# Patient Record
Sex: Female | Born: 1950 | Race: White | Hispanic: No | Marital: Married | Smoking: Never smoker
Health system: Southern US, Community
[De-identification: ages and names within clinical notes are randomized; demographics above are authoritative.]

## PROBLEM LIST (undated history)

## (undated) DIAGNOSIS — I639 Cerebral infarction, unspecified: Secondary | ICD-10-CM

## (undated) DIAGNOSIS — F319 Bipolar disorder, unspecified: Secondary | ICD-10-CM

## (undated) DIAGNOSIS — E039 Hypothyroidism, unspecified: Secondary | ICD-10-CM

## (undated) DIAGNOSIS — E7800 Pure hypercholesterolemia, unspecified: Secondary | ICD-10-CM

## (undated) DIAGNOSIS — E079 Disorder of thyroid, unspecified: Secondary | ICD-10-CM

## (undated) DIAGNOSIS — R29898 Other symptoms and signs involving the musculoskeletal system: Secondary | ICD-10-CM

## (undated) DIAGNOSIS — E785 Hyperlipidemia, unspecified: Secondary | ICD-10-CM

## (undated) DIAGNOSIS — J45909 Unspecified asthma, uncomplicated: Secondary | ICD-10-CM

## (undated) DIAGNOSIS — G459 Transient cerebral ischemic attack, unspecified: Secondary | ICD-10-CM

## (undated) DIAGNOSIS — M069 Rheumatoid arthritis, unspecified: Secondary | ICD-10-CM

## (undated) DIAGNOSIS — I6340 Cerebral infarction due to embolism of unspecified cerebral artery: Secondary | ICD-10-CM

## (undated) DIAGNOSIS — R05 Cough: Secondary | ICD-10-CM

## (undated) HISTORY — DX: Hypothyroidism, unspecified: E03.9

## (undated) HISTORY — PX: FOOT SURGERY: SHX648

## (undated) HISTORY — DX: Cerebral infarction, unspecified: I63.9

## (undated) HISTORY — DX: Cerebral infarction due to embolism of unspecified cerebral artery: I63.40

## (undated) HISTORY — DX: Rheumatoid arthritis, unspecified: M06.9

## (undated) HISTORY — DX: Cough: R05

## (undated) HISTORY — DX: Pure hypercholesterolemia, unspecified: E78.00

## (undated) HISTORY — DX: Unspecified asthma, uncomplicated: J45.909

## (undated) HISTORY — DX: Bipolar disorder, unspecified: F31.9

## (undated) HISTORY — DX: Transient cerebral ischemic attack, unspecified: G45.9

## (undated) HISTORY — DX: Hyperlipidemia, unspecified: E78.5

## (undated) HISTORY — DX: Disorder of thyroid, unspecified: E07.9

## (undated) HISTORY — DX: Other symptoms and signs involving the musculoskeletal system: R29.898

---

## 2000-11-13 ENCOUNTER — Encounter (INDEPENDENT_AMBULATORY_CARE_PROVIDER_SITE_OTHER): Payer: Self-pay | Admitting: Specialist

## 2000-11-13 ENCOUNTER — Ambulatory Visit (HOSPITAL_BASED_OUTPATIENT_CLINIC_OR_DEPARTMENT_OTHER): Admission: RE | Admit: 2000-11-13 | Discharge: 2000-11-13 | Payer: Self-pay | Admitting: General Surgery

## 2000-11-20 ENCOUNTER — Other Ambulatory Visit: Admission: RE | Admit: 2000-11-20 | Discharge: 2000-11-20 | Payer: Self-pay | Admitting: *Deleted

## 2000-11-29 ENCOUNTER — Other Ambulatory Visit: Admission: RE | Admit: 2000-11-29 | Discharge: 2000-11-29 | Payer: Self-pay | Admitting: *Deleted

## 2000-11-29 ENCOUNTER — Encounter (INDEPENDENT_AMBULATORY_CARE_PROVIDER_SITE_OTHER): Payer: Self-pay | Admitting: Specialist

## 2002-10-09 ENCOUNTER — Other Ambulatory Visit: Admission: RE | Admit: 2002-10-09 | Discharge: 2002-10-09 | Payer: Self-pay | Admitting: Obstetrics and Gynecology

## 2003-10-10 ENCOUNTER — Other Ambulatory Visit: Admission: RE | Admit: 2003-10-10 | Discharge: 2003-10-10 | Payer: Self-pay | Admitting: Obstetrics and Gynecology

## 2004-10-13 ENCOUNTER — Other Ambulatory Visit: Admission: RE | Admit: 2004-10-13 | Discharge: 2004-10-13 | Payer: Self-pay | Admitting: Obstetrics and Gynecology

## 2004-11-12 ENCOUNTER — Ambulatory Visit (HOSPITAL_COMMUNITY): Admission: RE | Admit: 2004-11-12 | Discharge: 2004-11-12 | Payer: Self-pay | Admitting: Gastroenterology

## 2004-11-12 ENCOUNTER — Encounter (INDEPENDENT_AMBULATORY_CARE_PROVIDER_SITE_OTHER): Payer: Self-pay | Admitting: *Deleted

## 2005-10-31 ENCOUNTER — Other Ambulatory Visit: Admission: RE | Admit: 2005-10-31 | Discharge: 2005-10-31 | Payer: Self-pay | Admitting: Obstetrics and Gynecology

## 2006-02-01 ENCOUNTER — Emergency Department (HOSPITAL_COMMUNITY): Admission: EM | Admit: 2006-02-01 | Discharge: 2006-02-01 | Payer: Self-pay | Admitting: Emergency Medicine

## 2006-09-25 ENCOUNTER — Encounter: Admission: RE | Admit: 2006-09-25 | Discharge: 2006-09-25 | Payer: Self-pay | Admitting: Family Medicine

## 2006-09-25 ENCOUNTER — Encounter (INDEPENDENT_AMBULATORY_CARE_PROVIDER_SITE_OTHER): Payer: Self-pay | Admitting: Specialist

## 2007-10-02 ENCOUNTER — Encounter: Admission: RE | Admit: 2007-10-02 | Discharge: 2007-10-02 | Payer: Self-pay | Admitting: Family Medicine

## 2007-12-04 ENCOUNTER — Emergency Department (HOSPITAL_COMMUNITY): Admission: EM | Admit: 2007-12-04 | Discharge: 2007-12-04 | Payer: Self-pay | Admitting: Emergency Medicine

## 2008-10-02 ENCOUNTER — Encounter: Admission: RE | Admit: 2008-10-02 | Discharge: 2008-10-02 | Payer: Self-pay | Admitting: Obstetrics and Gynecology

## 2009-08-23 ENCOUNTER — Emergency Department (HOSPITAL_COMMUNITY): Admission: EM | Admit: 2009-08-23 | Discharge: 2009-08-23 | Payer: Self-pay | Admitting: Emergency Medicine

## 2009-10-06 ENCOUNTER — Encounter: Admission: RE | Admit: 2009-10-06 | Discharge: 2009-10-06 | Payer: Self-pay | Admitting: Obstetrics and Gynecology

## 2010-08-11 LAB — POCT I-STAT, CHEM 8
BUN: 14 mg/dL (ref 6–23)
Calcium, Ion: 1.23 mmol/L (ref 1.12–1.32)
Creatinine, Ser: 0.7 mg/dL (ref 0.4–1.2)
Hemoglobin: 12.2 g/dL (ref 12.0–15.0)
TCO2: 25 mmol/L (ref 0–100)

## 2010-08-11 LAB — URINALYSIS, ROUTINE W REFLEX MICROSCOPIC
Hgb urine dipstick: NEGATIVE
Specific Gravity, Urine: 1.012 (ref 1.005–1.030)
pH: 7 (ref 5.0–8.0)

## 2010-08-11 LAB — POCT CARDIAC MARKERS
Myoglobin, poc: 68.1 ng/mL (ref 12–200)
Troponin i, poc: <0.05 ng/mL (ref 0.00–0.09)

## 2010-09-14 ENCOUNTER — Other Ambulatory Visit: Payer: Self-pay | Admitting: Obstetrics and Gynecology

## 2010-09-14 DIAGNOSIS — Z1231 Encounter for screening mammogram for malignant neoplasm of breast: Secondary | ICD-10-CM

## 2010-09-29 ENCOUNTER — Ambulatory Visit
Admission: RE | Admit: 2010-09-29 | Discharge: 2010-09-29 | Disposition: A | Discharge status: Home / Self Care | Payer: 59 | Source: Ambulatory Visit | Attending: Family Medicine | Admitting: Family Medicine

## 2010-09-29 ENCOUNTER — Other Ambulatory Visit: Payer: Self-pay | Admitting: Family Medicine

## 2010-09-29 DIAGNOSIS — R52 Pain, unspecified: Secondary | ICD-10-CM

## 2010-09-29 MED ORDER — IOHEXOL 300 MG/ML  SOLN
100.0000 mL | Freq: Once | INTRAMUSCULAR | Status: AC | PRN
  Administered 2010-09-29: 100 mL via INTRAVENOUS

## 2010-10-08 NOTE — Op Note (Signed)
NAME:  Shannon Collins, Shannon Collins              ACCOUNT NO.:  [id number]   MEDICAL RECORD NO.:  [id number]          PATIENT TYPE:  AMB   LOCATION:  ENDO                         FACILITY:  [facility]   PHYSICIAN:  Anselmo Rod, M.D.  DATE OF BIRTH:  17-Dec-1950   DATE OF PROCEDURE:  11/11/2004  DATE OF DISCHARGE:                                 OPERATIVE REPORT   PROCEDURE PERFORMED:  Esophagogastroduodenoscopy with gastric and small  bowel biopsies.   ENDOSCOPIST:  Charna Elizabeth, M.D.   INSTRUMENT USED:  Olympus video panendoscope.   INDICATIONS FOR PROCEDURE:  The patient is a 60 year old white female with a  history of rheumatoid arthritis and bipolar disorder undergoing  esophagogastroduodenoscopy for epigastric pain.  Rule out peptic ulcer  disease, esophagitis, gastritis, etc.  Small bowel biopsy is also planned as  the patient has had diarrhea and there is a family history of sprue  associated with her mother and her brother.   PREPROCEDURE PREPARATION:  Informed consent was procured from the patient.  The patient was fasted for eight hours prior to the procedure.   PREPROCEDURE PHYSICAL:  The patient had stable vital signs.  Neck supple,  chest clear to auscultation.  S1, S2 regular.  Abdomen soft with normal  bowel sounds.   DESCRIPTION OF PROCEDURE:  The patient was placed in the left lateral  decubitus position and sedated with 50 mg of Demerol and 5 mg of Versed in  slow incremental doses.  Once the patient was adequately sedated and  maintained on low-flow oxygen and continuous cardiac monitoring, the Olympus  video panendoscope was advanced through the mouth piece over the tongue into  the esophagus under direct vision.  The entire esophagus appeared normal  with no evidence of ring, stricture, masses, esophagitis or Barrett's  mucosa.  The scope was then advanced to the stomach.  A small hiatal hernia  was seen on high retroflexion.  There was evidence of moderate gastritis  throughout the gastric mucosa with a few erosions.  Biopsies were done to  rule out presence of Helicobacter pylori by pathology.  Proximal small bowel  appeared normal.  There was no outlet obstruction.  Proximal bowel biopsies  were done to rule out sprue.  The patient tolerated the procedure well  without complications.   IMPRESSION:  1.  Normal-appearing esophagus.  2.  Small hiatal hernia.  3.  Moderate gastritis with few antral erosions. Biopsy done for      Helicobacter pylori  for pathology.  4.  Normal proximal small bowel biopsies done to rule out sprue.   RECOMMENDATIONS:  1.  Await pathology results.  2.  Continue proton pump inhibitor.  3.  Avoid all nonsteroidals.  4.  Treat with antibiotics if Helicobacter pylori present.  5.  Proceed with a colonoscopy at this time.  Further recommendation will be      made thereafter.       JNM/MEDQ  D:  11/12/2004  T:  11/12/2004  Job:  [id number]   cc:   Sigmund Hazel, M.D.  [address]  [address]  [place], [place]  [address]  Fax: [phone]   Demetria Pore. Coral Spikes, M.D.  [address]  [address]  [place]  [place] [address]  Fax: [phone]

## 2010-10-08 NOTE — Op Note (Signed)
NAME:  Shannon Collins, Shannon Collins              ACCOUNT NO.:  [id number]   MEDICAL RECORD NO.:  [id number]          PATIENT TYPE:  AMB   LOCATION:  ENDO                         FACILITY:  [facility]   PHYSICIAN:  Anselmo Rod, M.D.  DATE OF BIRTH:  04/28/1951   DATE OF PROCEDURE:  11/12/2004  DATE OF DISCHARGE:                                 OPERATIVE REPORT   PROCEDURE PERFORMED:  Colonoscopy with multiple biopsies.   ENDOSCOPIST:  Charna Elizabeth, M.D.   INSTRUMENT USED:  Olympus video colonoscope.   INDICATIONS FOR PROCEDURE:  The patient is a 60 year old white female  undergoing a colonoscopy with random biopsies planned to rule out  collagenous colitis as she has a history of diarrhea.   PREPROCEDURE PREPARATION:  Informed consent was procured from the patient.  The patient was fasted for eight hours prior to the procedure and prepped  with a bottle of magnesium citrate and a gallon of GoLYTELY the night prior  to the procedure.  The risks and benefits of the procedure including a 10%  miss rate for colon polyps or cancers was discussed with the patient as  well.   PREPROCEDURE PHYSICAL:  The patient had stable vital signs.  Neck supple.  Chest clear to auscultation.  S1 and S2 regular.  Abdomen soft with normal  bowel sounds.   DESCRIPTION OF PROCEDURE:  The patient was placed in left lateral decubitus  position and sedated with an additional 50 mg of Demerol and 5 mg of Versed  in slow incremental doses.  Once the patient was adequately sedated and  maintained on low flow oxygen and continuous cardiac monitoring, the Olympus  video colonoscope was advanced from the rectum to the cecum.  The  appendicular orifice and ileocecal valve were clearly visualized and  photographed. Three small sessile polyps were biopsied from the distal right  colon.  Random biopsies were done to rule out collagenous colitis.  The  terminal ileum appeared healthy and without lesion.  The rest of the exam  was  unremarkable.  Retroflexion in the rectum revealed no abnormalities.   IMPRESSION:  1.  Three small sessile polyps biopsied from the distal right colon.  2.  Random biopsies done to rule out sprue.  3.  Normal-appearing terminal ileum.   RECOMMENDATIONS:  1.  Await pathology results.  2.  Avoid all nonsteroidals including aspirin for now.  3.  Outpatient followup in the next two weeks for further recommendations.   RECOMMENDATIONS:       JNM/MEDQ  D:  11/12/2004  T:  11/12/2004  Job:  [id number]   cc:   Sigmund Hazel, M.D.  [address]  [address] [place], [place] [address]  Fax: [phone]   Demetria Pore. Coral Spikes, M.D.  [address]  [address]  [place]  [place] [address]  Fax: [phone]

## 2010-10-13 ENCOUNTER — Ambulatory Visit
Admission: RE | Admit: 2010-10-13 | Discharge: 2010-10-13 | Disposition: A | Discharge status: Home / Self Care | Payer: 59 | Source: Ambulatory Visit | Attending: Obstetrics and Gynecology | Admitting: Obstetrics and Gynecology

## 2010-10-13 DIAGNOSIS — Z1231 Encounter for screening mammogram for malignant neoplasm of breast: Secondary | ICD-10-CM

## 2011-02-17 LAB — BASIC METABOLIC PANEL
BUN: 11
CO2: 26
Calcium: 9.8
GFR calc Af Amer: >60
GFR calc non Af Amer: >60
Potassium: 4.1
Sodium: 140

## 2011-02-17 LAB — DIFFERENTIAL
Basophils Absolute: 0
Eosinophils Relative: 11 — ABNORMAL HIGH
Lymphocytes Relative: 17
Lymphs Abs: 1.2

## 2011-02-17 LAB — POCT CARDIAC MARKERS
CKMB, poc: 1
Myoglobin, poc: 85.9
Operator id: 285491

## 2011-02-17 LAB — POCT I-STAT, CHEM 8
BUN: 12
Calcium, Ion: 1.24
Creatinine, Ser: 0.9
Glucose, Bld: 87
HCT: 39
Hemoglobin: 13.3
Sodium: 140

## 2011-02-17 LAB — CBC
HCT: 38.8
Hemoglobin: 13
MCHC: 33.6
Platelets: 276
RBC: 4.19

## 2011-09-19 ENCOUNTER — Other Ambulatory Visit: Payer: Self-pay | Admitting: Obstetrics and Gynecology

## 2011-09-19 DIAGNOSIS — Z1231 Encounter for screening mammogram for malignant neoplasm of breast: Secondary | ICD-10-CM

## 2011-10-14 ENCOUNTER — Ambulatory Visit: Payer: 59

## 2011-10-21 ENCOUNTER — Ambulatory Visit
Admission: RE | Admit: 2011-10-21 | Discharge: 2011-10-21 | Disposition: A | Discharge status: Home / Self Care | Payer: 59 | Source: Ambulatory Visit | Attending: Obstetrics and Gynecology | Admitting: Obstetrics and Gynecology

## 2011-10-21 DIAGNOSIS — Z1231 Encounter for screening mammogram for malignant neoplasm of breast: Secondary | ICD-10-CM

## 2011-12-14 ENCOUNTER — Encounter: Payer: Self-pay | Admitting: Obstetrics and Gynecology

## 2012-09-12 ENCOUNTER — Other Ambulatory Visit: Payer: Self-pay

## 2012-09-12 DIAGNOSIS — Z1231 Encounter for screening mammogram for malignant neoplasm of breast: Secondary | ICD-10-CM

## 2012-10-23 ENCOUNTER — Ambulatory Visit
Admission: RE | Admit: 2012-10-23 | Discharge: 2012-10-23 | Disposition: A | Discharge status: Home / Self Care | Payer: 59 | Source: Ambulatory Visit

## 2012-10-23 DIAGNOSIS — Z1231 Encounter for screening mammogram for malignant neoplasm of breast: Secondary | ICD-10-CM

## 2013-09-18 ENCOUNTER — Other Ambulatory Visit: Payer: Self-pay

## 2013-09-18 DIAGNOSIS — Z1231 Encounter for screening mammogram for malignant neoplasm of breast: Secondary | ICD-10-CM

## 2013-10-24 ENCOUNTER — Encounter (INDEPENDENT_AMBULATORY_CARE_PROVIDER_SITE_OTHER): Payer: Self-pay

## 2013-10-24 ENCOUNTER — Ambulatory Visit
Admission: RE | Admit: 2013-10-24 | Discharge: 2013-10-24 | Disposition: A | Discharge status: Home / Self Care | Payer: 59 | Source: Ambulatory Visit

## 2013-10-24 DIAGNOSIS — Z1231 Encounter for screening mammogram for malignant neoplasm of breast: Secondary | ICD-10-CM

## 2014-07-08 ENCOUNTER — Ambulatory Visit (INDEPENDENT_AMBULATORY_CARE_PROVIDER_SITE_OTHER): Payer: 59 | Admitting: Pulmonary Disease

## 2014-07-08 ENCOUNTER — Encounter (INDEPENDENT_AMBULATORY_CARE_PROVIDER_SITE_OTHER): Payer: Self-pay

## 2014-07-08 ENCOUNTER — Encounter: Payer: Self-pay | Admitting: Pulmonary Disease

## 2014-07-08 ENCOUNTER — Ambulatory Visit (INDEPENDENT_AMBULATORY_CARE_PROVIDER_SITE_OTHER)
Admission: RE | Admit: 2014-07-08 | Discharge: 2014-07-08 | Disposition: A | Discharge status: Home / Self Care | Payer: 59 | Source: Ambulatory Visit | Attending: Pulmonary Disease | Admitting: Pulmonary Disease

## 2014-07-08 VITALS — BP 112/68 | HR 85 | Temp 97.4°F | Ht 63.0 in | Wt 136.0 lb

## 2014-07-08 DIAGNOSIS — R053 Chronic cough: Secondary | ICD-10-CM

## 2014-07-08 DIAGNOSIS — R059 Cough, unspecified: Secondary | ICD-10-CM

## 2014-07-08 DIAGNOSIS — R05 Cough: Secondary | ICD-10-CM | POA: Insufficient documentation

## 2014-07-08 HISTORY — DX: Chronic cough: R05.3

## 2014-07-08 MED ORDER — OMEPRAZOLE 40 MG PO CPDR
40.0000 mg | DELAYED_RELEASE_CAPSULE | Freq: Two times a day (BID) | ORAL | Status: DC
Start: 2014-07-08 — End: 2014-07-29

## 2014-07-08 NOTE — Patient Instructions (Signed)
Will check chest xray today, and call you with results. Will start on nasonex 2 each nostril each am for postnasal drip for next 3 weeks. Will give you a prescription for higher dose omeprazole to treat reflux more aggressively.  Take 40mg  one in am and pm before meal for next 3 weeks. Use hard candy during the day to bathe the back of your throat to suppress tickle/cough.  No mint or menthol or cough drops.  followup with me again in 3 weeks.

## 2014-07-08 NOTE — Progress Notes (Signed)
   Subjective:    Patient ID: Shannon Collins, female    DOB: [birth year], 64 y.o.   MRN: [id number]  HPI The patient is a 64 year old female who I've been asked to see for chronic cough. The patient states the cough started in January of last year, and has been consistent over the last one year. She describes intermittent episodes of "congestion", that consist of cough with scant mucus that can be white to yellow in nature. This is associated with audible wheezing, and the episodes can occur every few days. She has a tickle in her throat, but does not have a lot of throat clearing. She will typically just swallow in the sensation will dissipate. She has noticed significant hoarseness over the last few months, and does admit to having postnasal drip. She has not taken an antihistamine, and denies any sinus pressure or congestion. She does have a history of reflux, and has had he are visits in the past for chest pain that was felt to be related to reflux. She thinks her low dose omeprazole has helped. She has no dyspnea on exertion with her activities of daily living or even more moderate to heavy activities. She does notice increasing shortness of breath whenever she lies down. The patient tells me that she was felt to have asthma in high school, and that it becomes an issue whenever she has an upper respiratory infection or exposed to cats. The patient has a history of "arthritis, and has been on Enbrel for at least 10 years. She was changed to Humira the end of last year because of the cough. She was on methotrexate approximately 5 years ago, and it was discontinued because it was not felt to be helping her rheumatoid symptoms.  The patient cannot remember her last chest x-ray, but thought she may have had one last year.   Review of Systems  Constitutional: Negative for fever and unexpected weight change.  HENT: Positive for congestion and postnasal drip. Negative for dental problem, ear pain, nosebleeds,  rhinorrhea, sinus pressure, sneezing, sore throat and trouble swallowing.   Eyes: Negative for redness and itching.  Respiratory: Positive for cough, chest tightness and wheezing. Negative for shortness of breath.   Cardiovascular: Negative for palpitations and leg swelling.  Gastrointestinal: Negative for nausea and vomiting.  Genitourinary: Negative for dysuria.  Musculoskeletal: Negative for joint swelling.  Skin: Negative for rash.  Neurological: Negative for headaches.  Hematological: Does not bruise/bleed easily.  Psychiatric/Behavioral: Negative for dysphoric mood. The patient is not nervous/anxious.        Objective:   Physical Exam Constitutional:  Well developed, no acute distress  HENT:  Nares patent without discharge  Oropharynx without exudate, palate and uvula are normal  Eyes:  Perrla, eomi, no scleral icterus  Neck:  No JVD, no TMG  Cardiovascular:  Normal rate, regular rhythm, no rubs or gallops.  No murmurs        Intact distal pulses  Pulmonary :  Normal breath sounds, no stridor or respiratory distress   No rales, rhonchi, or wheezing  Abdominal:  Soft, nondistended, bowel sounds present.  No tenderness noted.   Musculoskeletal:  No lower extremity edema noted.  Lymph Nodes:  No cervical lymphadenopathy noted  Skin:  No cyanosis noted  Neurologic:  Alert, appropriate, moves all 4 extremities without obvious deficit.         Assessment & Plan:

## 2014-07-08 NOTE — Assessment & Plan Note (Signed)
The patient has a chronic cough and audible wheezing that has been going on since January of last year. From her description, it certainly sounds upper airway in origin, and her spirometry and lung exam today are normal. She does not have any dyspnea on exertion, and it does not interfere with her daily activities. I would like to treat her more aggressively for upper airway causes of cough, including more aggressive treatment for reflux and also postnasal drip. I also discussed with her the concept of cyclical coughing, and have given her behavioral therapies to try that may reduce the frequency of cough.  I will check a chest x-ray today for completeness given her history of rheumatoid arthritis, and she may need an upper airway evaluation by otolaryngology if she does not improve by the next visit.

## 2014-07-28 ENCOUNTER — Institutional Professional Consult (permissible substitution): Payer: Self-pay | Admitting: Pulmonary Disease

## 2014-07-29 ENCOUNTER — Ambulatory Visit (INDEPENDENT_AMBULATORY_CARE_PROVIDER_SITE_OTHER): Payer: 59 | Admitting: Pulmonary Disease

## 2014-07-29 ENCOUNTER — Ambulatory Visit: Payer: 59 | Admitting: Pulmonary Disease

## 2014-07-29 ENCOUNTER — Encounter: Payer: Self-pay | Admitting: Pulmonary Disease

## 2014-07-29 VITALS — BP 124/60 | HR 89 | Temp 97.6°F | Ht 63.75 in | Wt 135.8 lb

## 2014-07-29 DIAGNOSIS — R05 Cough: Secondary | ICD-10-CM

## 2014-07-29 DIAGNOSIS — R053 Chronic cough: Secondary | ICD-10-CM

## 2014-07-29 MED ORDER — OMEPRAZOLE 40 MG PO CPDR: 40.0000 mg | DELAYED_RELEASE_CAPSULE | Freq: Two times a day (BID) | ORAL | Status: DC

## 2014-07-29 NOTE — Patient Instructions (Signed)
Stay on nasonex 2 sprays each nostril once a day for the next 4 weeks Will get you a prescription for omeprazole 40mg  twice a day for another 4 weeks Continue with no throat clearing, and using hard candy Will refer to ENT to take a look at your upper airway to make sure is not abnormal. Please give me some feedback at the end of 4 weeks regarding how things are going.

## 2014-07-29 NOTE — Assessment & Plan Note (Signed)
The patient has been treated aggressively for upper airway causes of cough since the last visit 3 weeks ago, and she tells me that her cough is 70% improved. This is excellent progress over a short period of time. She continues to have hoarseness and a weak voice today, providing further support for an upper airway cause of her cough. I have asked her to continue on treatment for postnasal drip and reflux, as well as behavioral therapies for cyclical coughing. Think she needs to have an upper airway exam by otolaryngology given her history of rheumatoid arthritis and persistent hoarseness. I would like to continue treating her aggressively for postnasal drip and reflux since she has had significant improvement. Hopefully this will continue to improve over time.

## 2014-07-29 NOTE — Progress Notes (Signed)
   Subjective:    Patient ID: Shannon Collins, female    DOB: 1950-07-25, 64 y.o.   MRN: [id number]  HPI The patient comes in today for follow-up of her chronic cough, felt to be upper airway in origin. At the last visit, she was treated for postnasal drip and reflux, as well as behavioral therapies for cyclical coughing. She feels that her cough is 70% improved since the last visit, but is having ongoing hoarseness. She is having a lot of mouth dryness, and thinks it may be related to her Seroquel.   Review of Systems  Constitutional: Negative for fever and unexpected weight change.  HENT: Positive for postnasal drip, sore throat and trouble swallowing. Negative for congestion, dental problem, ear pain, nosebleeds, rhinorrhea, sinus pressure and sneezing.   Eyes: Negative for redness and itching.  Respiratory: Positive for wheezing. Negative for cough, chest tightness and shortness of breath.   Cardiovascular: Negative for palpitations and leg swelling.  Gastrointestinal: Negative for nausea and vomiting.  Genitourinary: Negative for dysuria.  Musculoskeletal: Negative for joint swelling.  Skin: Negative for rash.  Neurological: Negative for headaches.  Hematological: Does not bruise/bleed easily.  Psychiatric/Behavioral: Negative for dysphoric mood. The patient is not nervous/anxious.        Objective:   Physical Exam Well-developed female in no acute distress Nose without purulence or discharge noted Neck without lymphadenopathy or thyromegaly Lower extremities without edema, no cyanosis Alert and oriented, moves all 4 extremities.       Assessment & Plan:

## 2014-09-01 ENCOUNTER — Telehealth: Payer: Self-pay | Admitting: Pulmonary Disease

## 2014-09-01 NOTE — Telephone Encounter (Signed)
Patient says she has no more hoarseness in her throat, she still has a little cough, but nowhere close to where it was a while back.  Patient says Dr. Clovis Pu reduced the amount of Seroquel to 75mg  and 2 days after he did that, the hoarseness went away, she thinks that may have been the cause of the cough and hoarseness.  She went to ENT and they told her there was nothing wrong with her throat.    FYI to Dr. Gwenette Greet

## 2014-09-01 NOTE — Telephone Encounter (Signed)
Spoke with pt, aware of KC's recs.  Nothing further needed.

## 2014-09-01 NOTE — Telephone Encounter (Signed)
Left message to call back  

## 2014-09-01 NOTE — Telephone Encounter (Signed)
Let her know to finish up her acid reflux med, nasal spray, and see how she does. Still needs to use hard candy and no throat clearing, and to avoid overuse of voice. If her cough does well, no further f/u needed.  If not, then will need ov if cough re-escalates again.

## 2014-09-22 ENCOUNTER — Other Ambulatory Visit: Payer: Self-pay

## 2014-09-22 DIAGNOSIS — Z1231 Encounter for screening mammogram for malignant neoplasm of breast: Secondary | ICD-10-CM

## 2014-09-23 ENCOUNTER — Inpatient Hospital Stay (HOSPITAL_COMMUNITY)
Admission: EM | Admit: 2014-09-23 | Discharge: 2014-09-25 | DRG: 066 | Disposition: A | Discharge status: Home / Self Care | Payer: 59 | Attending: Internal Medicine | Admitting: Internal Medicine

## 2014-09-23 DIAGNOSIS — I639 Cerebral infarction, unspecified: Principal | ICD-10-CM | POA: Diagnosis present

## 2014-09-23 DIAGNOSIS — Z7982 Long term (current) use of aspirin: Secondary | ICD-10-CM

## 2014-09-23 DIAGNOSIS — R531 Weakness: Secondary | ICD-10-CM | POA: Diagnosis not present

## 2014-09-23 DIAGNOSIS — F319 Bipolar disorder, unspecified: Secondary | ICD-10-CM | POA: Diagnosis present

## 2014-09-23 DIAGNOSIS — I10 Essential (primary) hypertension: Secondary | ICD-10-CM | POA: Diagnosis present

## 2014-09-23 DIAGNOSIS — M069 Rheumatoid arthritis, unspecified: Secondary | ICD-10-CM | POA: Diagnosis present

## 2014-09-23 DIAGNOSIS — J45909 Unspecified asthma, uncomplicated: Secondary | ICD-10-CM | POA: Diagnosis present

## 2014-09-23 DIAGNOSIS — I6340 Cerebral infarction due to embolism of unspecified cerebral artery: Secondary | ICD-10-CM | POA: Insufficient documentation

## 2014-09-23 DIAGNOSIS — E039 Hypothyroidism, unspecified: Secondary | ICD-10-CM

## 2014-09-23 DIAGNOSIS — E785 Hyperlipidemia, unspecified: Secondary | ICD-10-CM

## 2014-09-23 DIAGNOSIS — G459 Transient cerebral ischemic attack, unspecified: Secondary | ICD-10-CM | POA: Diagnosis present

## 2014-09-23 DIAGNOSIS — K219 Gastro-esophageal reflux disease without esophagitis: Secondary | ICD-10-CM | POA: Diagnosis present

## 2014-09-23 DIAGNOSIS — R29898 Other symptoms and signs involving the musculoskeletal system: Secondary | ICD-10-CM | POA: Insufficient documentation

## 2014-09-23 NOTE — ED Notes (Addendum)
Pt arrived to the ED with a complaint of left arm weakness.  Pt arose to get a glass of water and was unable to lift said glass of water.  Pt also was unable to return glass in an upright position either. Pt also states earlier today she saw "floaters" out of her left eye.  Pt has no facial droop.  Pt has slight weakness with left grip but is able to lift arm on command.  Pt did stumble on some words but has no slurred speech.  Pupils are PERRLA.  Pt denies headache, N/V.

## 2014-09-24 ENCOUNTER — Emergency Department (HOSPITAL_COMMUNITY): Payer: 59

## 2014-09-24 ENCOUNTER — Observation Stay (HOSPITAL_COMMUNITY): Payer: 59

## 2014-09-24 ENCOUNTER — Encounter (HOSPITAL_COMMUNITY): Payer: Self-pay | Admitting: Emergency Medicine

## 2014-09-24 DIAGNOSIS — I6340 Cerebral infarction due to embolism of unspecified cerebral artery: Secondary | ICD-10-CM | POA: Diagnosis not present

## 2014-09-24 DIAGNOSIS — I639 Cerebral infarction, unspecified: Secondary | ICD-10-CM | POA: Diagnosis not present

## 2014-09-24 DIAGNOSIS — E039 Hypothyroidism, unspecified: Secondary | ICD-10-CM

## 2014-09-24 DIAGNOSIS — J45909 Unspecified asthma, uncomplicated: Secondary | ICD-10-CM | POA: Diagnosis present

## 2014-09-24 DIAGNOSIS — I6789 Other cerebrovascular disease: Secondary | ICD-10-CM

## 2014-09-24 DIAGNOSIS — E785 Hyperlipidemia, unspecified: Secondary | ICD-10-CM

## 2014-09-24 DIAGNOSIS — I10 Essential (primary) hypertension: Secondary | ICD-10-CM | POA: Diagnosis present

## 2014-09-24 DIAGNOSIS — G459 Transient cerebral ischemic attack, unspecified: Secondary | ICD-10-CM

## 2014-09-24 DIAGNOSIS — R29898 Other symptoms and signs involving the musculoskeletal system: Secondary | ICD-10-CM

## 2014-09-24 DIAGNOSIS — K219 Gastro-esophageal reflux disease without esophagitis: Secondary | ICD-10-CM | POA: Diagnosis present

## 2014-09-24 DIAGNOSIS — I340 Nonrheumatic mitral (valve) insufficiency: Secondary | ICD-10-CM | POA: Diagnosis not present

## 2014-09-24 DIAGNOSIS — M069 Rheumatoid arthritis, unspecified: Secondary | ICD-10-CM | POA: Diagnosis present

## 2014-09-24 DIAGNOSIS — Z7982 Long term (current) use of aspirin: Secondary | ICD-10-CM | POA: Diagnosis not present

## 2014-09-24 DIAGNOSIS — G451 Carotid artery syndrome (hemispheric): Secondary | ICD-10-CM | POA: Diagnosis not present

## 2014-09-24 DIAGNOSIS — F319 Bipolar disorder, unspecified: Secondary | ICD-10-CM

## 2014-09-24 DIAGNOSIS — R531 Weakness: Secondary | ICD-10-CM | POA: Diagnosis present

## 2014-09-24 HISTORY — DX: Bipolar disorder, unspecified: F31.9

## 2014-09-24 HISTORY — DX: Cerebral infarction, unspecified: I63.9

## 2014-09-24 HISTORY — DX: Hyperlipidemia, unspecified: E78.5

## 2014-09-24 HISTORY — DX: Hypothyroidism, unspecified: E03.9

## 2014-09-24 HISTORY — DX: Transient cerebral ischemic attack, unspecified: G45.9

## 2014-09-24 LAB — CBC
HCT: 39.2 % (ref 36.0–46.0)
HEMOGLOBIN: 12.5 g/dL (ref 12.0–15.0)
MCH: 28.2 pg (ref 26.0–34.0)
MCHC: 31.9 g/dL (ref 30.0–36.0)
MCV: 88.3 fL (ref 78.0–100.0)
Platelets: 236 10*3/uL (ref 150–400)
RBC: 4.44 MIL/uL (ref 3.87–5.11)
RDW: 14.2 % (ref 11.5–15.5)
WBC: 6.2 10*3/uL (ref 4.0–10.5)

## 2014-09-24 LAB — URINALYSIS, ROUTINE W REFLEX MICROSCOPIC
Bilirubin Urine: NEGATIVE
GLUCOSE, UA: NEGATIVE mg/dL
Hgb urine dipstick: NEGATIVE
KETONES UR: NEGATIVE mg/dL
Nitrite: NEGATIVE
PROTEIN: NEGATIVE mg/dL
Specific Gravity, Urine: 1.002 — ABNORMAL LOW (ref 1.005–1.030)
UROBILINOGEN UA: 0.2 mg/dL (ref 0.0–1.0)
pH: 6.5 (ref 5.0–8.0)

## 2014-09-24 LAB — COMPREHENSIVE METABOLIC PANEL
ALBUMIN: 4.2 g/dL (ref 3.5–5.0)
ALT: 53 U/L (ref 14–54)
ANION GAP: 4 — AB (ref 5–15)
AST: 46 U/L — ABNORMAL HIGH (ref 15–41)
Alkaline Phosphatase: 94 U/L (ref 38–126)
BILIRUBIN TOTAL: 0.5 mg/dL (ref 0.3–1.2)
BUN: 15 mg/dL (ref 6–20)
CO2: 28 mmol/L (ref 22–32)
CREATININE: 0.79 mg/dL (ref 0.44–1.00)
Calcium: 9.4 mg/dL (ref 8.9–10.3)
Chloride: 108 mmol/L (ref 101–111)
GFR calc non Af Amer: >60 mL/min (ref >60)
Glucose, Bld: 101 mg/dL — ABNORMAL HIGH (ref 70–99)
Potassium: 3.4 mmol/L — ABNORMAL LOW (ref 3.5–5.1)
Sodium: 140 mmol/L (ref 135–145)
Total Protein: 7.6 g/dL (ref 6.5–8.1)

## 2014-09-24 LAB — LIPID PANEL
CHOL/HDL RATIO: 2.9 ratio
CHOLESTEROL: 169 mg/dL (ref 0–200)
HDL: 58 mg/dL (ref >40)
LDL CALC: 96 mg/dL (ref 0–99)
TRIGLYCERIDES: 77 mg/dL (ref <150)
VLDL: 15 mg/dL (ref 0–40)

## 2014-09-24 LAB — URINE MICROSCOPIC-ADD ON

## 2014-09-24 LAB — RAPID URINE DRUG SCREEN, HOSP PERFORMED
Amphetamines: NOT DETECTED
BARBITURATES: NOT DETECTED
BENZODIAZEPINES: NOT DETECTED
Cocaine: NOT DETECTED
Opiates: NOT DETECTED
Tetrahydrocannabinol: NOT DETECTED

## 2014-09-24 LAB — DIFFERENTIAL
Basophils Absolute: 0.1 10*3/uL (ref 0.0–0.1)
Basophils Relative: 1 % (ref 0–1)
EOS ABS: 0.5 10*3/uL (ref 0.0–0.7)
Eosinophils Relative: 8 % — ABNORMAL HIGH (ref 0–5)
LYMPHS PCT: 35 % (ref 12–46)
Lymphs Abs: 2.2 10*3/uL (ref 0.7–4.0)
Monocytes Absolute: 0.5 10*3/uL (ref 0.1–1.0)
Monocytes Relative: 8 % (ref 3–12)
NEUTROS PCT: 48 % (ref 43–77)
Neutro Abs: 3 10*3/uL (ref 1.7–7.7)

## 2014-09-24 LAB — PROTIME-INR
INR: 0.91 (ref 0.00–1.49)
Prothrombin Time: 12.4 seconds (ref 11.6–15.2)

## 2014-09-24 LAB — I-STAT CHEM 8, ED
BUN: 16 mg/dL (ref 6–20)
CREATININE: 0.8 mg/dL (ref 0.44–1.00)
Calcium, Ion: 1.2 mmol/L (ref 1.13–1.30)
Chloride: 106 mmol/L (ref 101–111)
Glucose, Bld: 102 mg/dL — ABNORMAL HIGH (ref 70–99)
HCT: 40 % (ref 36.0–46.0)
Hemoglobin: 13.6 g/dL (ref 12.0–15.0)
POTASSIUM: 3.5 mmol/L (ref 3.5–5.1)
Sodium: 140 mmol/L (ref 135–145)
TCO2: 22 mmol/L (ref 0–100)

## 2014-09-24 LAB — APTT: APTT: 36 s (ref 24–37)

## 2014-09-24 LAB — I-STAT TROPONIN, ED: Troponin i, poc: 0 ng/mL (ref 0.00–0.08)

## 2014-09-24 LAB — ETHANOL: Alcohol, Ethyl (B): <5 mg/dL (ref <5)

## 2014-09-24 MED ORDER — QUETIAPINE FUMARATE ER 50 MG PO TB24: 75.0000 mg | ORAL_TABLET | Freq: Every day | ORAL | Status: DC

## 2014-09-24 MED ORDER — LEVOTHYROXINE SODIUM 50 MCG PO TABS
50.0000 ug | ORAL_TABLET | Freq: Every day | ORAL | Status: DC
  Administered 2014-09-24 – 2014-09-25 (×2): 50 ug via ORAL
  Filled 2014-09-24 (×2): qty 1

## 2014-09-24 MED ORDER — PANTOPRAZOLE SODIUM 40 MG PO TBEC
80.0000 mg | DELAYED_RELEASE_TABLET | Freq: Every day | ORAL | Status: DC
  Administered 2014-09-24 – 2014-09-25 (×2): 80 mg via ORAL
  Filled 2014-09-24 (×3): qty 2

## 2014-09-24 MED ORDER — ATORVASTATIN CALCIUM 10 MG PO TABS
10.0000 mg | ORAL_TABLET | Freq: Every day | ORAL | Status: DC
  Administered 2014-09-24 – 2014-09-25 (×2): 10 mg via ORAL
  Filled 2014-09-24 (×2): qty 1

## 2014-09-24 MED ORDER — PNEUMOCOCCAL VAC POLYVALENT 25 MCG/0.5ML IJ INJ
0.5000 mL | INJECTION | INTRAMUSCULAR | Status: AC
  Administered 2014-09-25: 0.5 mL via INTRAMUSCULAR
  Filled 2014-09-24: qty 0.5

## 2014-09-24 MED ORDER — LORAZEPAM 1 MG PO TABS
1.0000 mg | ORAL_TABLET | Freq: Three times a day (TID) | ORAL | Status: DC | PRN
  Administered 2014-09-24: 1 mg via ORAL
  Filled 2014-09-24: qty 1

## 2014-09-24 MED ORDER — CYCLOSPORINE 0.05 % OP EMUL
1.0000 [drp] | Freq: Two times a day (BID) | OPHTHALMIC | Status: DC
  Administered 2014-09-24 – 2014-09-25 (×2): 1 [drp] via OPHTHALMIC
  Filled 2014-09-24 (×5): qty 1

## 2014-09-24 MED ORDER — HEPARIN SODIUM (PORCINE) 5000 UNIT/ML IJ SOLN
5000.0000 [IU] | Freq: Three times a day (TID) | INTRAMUSCULAR | Status: DC
  Administered 2014-09-24 – 2014-09-25 (×4): 5000 [IU] via SUBCUTANEOUS
  Filled 2014-09-24 (×4): qty 1

## 2014-09-24 MED ORDER — ALBUTEROL SULFATE HFA 108 (90 BASE) MCG/ACT IN AERS: 2.0000 | INHALATION_SPRAY | Freq: Four times a day (QID) | RESPIRATORY_TRACT | Status: DC | PRN

## 2014-09-24 MED ORDER — ASPIRIN 325 MG PO TABS
325.0000 mg | ORAL_TABLET | Freq: Every day | ORAL | Status: DC
  Administered 2014-09-24: 325 mg via ORAL
  Filled 2014-09-24: qty 1

## 2014-09-24 MED ORDER — QUETIAPINE FUMARATE ER 50 MG PO TB24
50.0000 mg | ORAL_TABLET | Freq: Every day | ORAL | Status: DC
  Administered 2014-09-24: 50 mg via ORAL
  Filled 2014-09-24 (×4): qty 1

## 2014-09-24 MED ORDER — STROKE: EARLY STAGES OF RECOVERY BOOK
Freq: Once | Status: AC
  Administered 2014-09-24: 06:00:00
  Filled 2014-09-24: qty 1

## 2014-09-24 MED ORDER — ASPIRIN EC 81 MG PO TBEC: 81.0000 mg | DELAYED_RELEASE_TABLET | Freq: Every day | ORAL | Status: DC

## 2014-09-24 MED ORDER — ALBUTEROL SULFATE (2.5 MG/3ML) 0.083% IN NEBU: 2.5000 mg | INHALATION_SOLUTION | Freq: Four times a day (QID) | RESPIRATORY_TRACT | Status: DC | PRN

## 2014-09-24 MED ORDER — CLOPIDOGREL BISULFATE 75 MG PO TABS
75.0000 mg | ORAL_TABLET | Freq: Every day | ORAL | Status: DC
  Administered 2014-09-24 – 2014-09-25 (×2): 75 mg via ORAL
  Filled 2014-09-24 (×2): qty 1

## 2014-09-24 NOTE — ED Provider Notes (Signed)
CSN: [id number]     Arrival date & time 09/23/14  2348 History   First MD Initiated Contact with Patient 09/24/14 0014     Chief Complaint  Patient presents with  . Numbness     (Consider location/radiation/quality/duration/timing/severity/associated sxs/prior Treatment) HPI Patient presents with acute onset left arm weakness and numbness that started at 11 PM this evening. She states she was unable to drink glass of water using her left hand. She then had difficulty setting the water down on the nightstand. Symptoms lasted roughly 30 minutes and then began improving. She currently has mild decreased sensation in the left upper extremity and residual weakness though mostly resolved. Denied any lower extremity weakness. She's had no slurred speech but has had some trouble word finding. Past Medical History  Diagnosis Date  . Asthma   . Rheumatoid arthritis   . High cholesterol   . Thyroid condition    Past Surgical History  Procedure Laterality Date  . Foot surgery      x2  . Cesarean section     Family History  Problem Relation Age of Onset  . Asthma Father    History  Substance Use Topics  . Smoking status: Never Smoker   . Smokeless tobacco: Not on file  . Alcohol Use: No   OB History    No data available     Review of Systems  Constitutional: Negative for fever and chills.  Respiratory: Negative for cough and shortness of breath.   Cardiovascular: Negative for chest pain.  Gastrointestinal: Negative for nausea, vomiting and abdominal pain.  Musculoskeletal: Negative for back pain, neck pain and neck stiffness.  Skin: Negative for rash and wound.  Neurological: Positive for speech difficulty, weakness and numbness. Negative for dizziness, light-headedness and headaches.  All other systems reviewed and are negative.     Allergies  Azithromycin; Biaxin; and Codeine  Home Medications   Prior to Admission medications   Medication Sig Start Date End Date Taking?  Authorizing Provider  albuterol (PROVENTIL HFA;VENTOLIN HFA) 108 (90 BASE) MCG/ACT inhaler Inhale 2 puffs into the lungs every 6 (six) hours as needed for wheezing or shortness of breath.    Historical Provider, MD  aspirin 325 MG tablet Take 325 mg by mouth daily.    Historical Provider, MD  atorvastatin (LIPITOR) 10 MG tablet Take 10 mg by mouth daily.    Historical Provider, MD  beta carotene w/minerals (OCUVITE) tablet Take 1 tablet by mouth daily.    Historical Provider, MD  calcium carbonate (OS-CAL) 600 MG TABS tablet Take 1,200 mg by mouth daily.    Historical Provider, MD  Cholecalciferol (VITAMIN D) 2000 UNITS CAPS Take by mouth daily.    Historical Provider, MD  cycloSPORINE (RESTASIS) 0.05 % ophthalmic emulsion 1 drop 2 (two) times daily.    Historical Provider, MD  GRAPE SEED CR PO Take by mouth daily.    Historical Provider, MD  HUMIRA 40 MG/0.8ML PSKT Inject into skin once every other week 07/09/14   Historical Provider, MD  levothyroxine (SYNTHROID, LEVOTHROID) 50 MCG tablet Take 50 mcg by mouth daily before breakfast.    Historical Provider, MD  mometasone (NASONEX) 50 MCG/ACT nasal spray Place into the nose daily.    Historical Provider, MD  Multiple Vitamin (MULTIVITAMIN) tablet Take 1 tablet by mouth daily.    Historical Provider, MD  Omega-3 Fatty Acids (FISH OIL) 1000 MG CAPS Take 4,000 capsules by mouth daily.    Historical Provider, MD  omeprazole (PRILOSEC) 40  MG capsule Take 1 capsule (40 mg total) by mouth 2 (two) times daily. 07/29/14   Kathee Delton, MD  potassium gluconate 595 MG TABS tablet Take 595 mg by mouth daily.    Historical Provider, MD  SEROQUEL XR 50 MG TB24 24 hr tablet Take 2 tablets at bedtime 07/14/14   Historical Provider, MD  vitamin C (ASCORBIC ACID) 500 MG tablet Take 500 mg by mouth daily.    Historical Provider, MD  vitamin E 400 UNIT capsule Take 400 Units by mouth daily.    Historical Provider, MD   BP 138/69 mmHg  Pulse 77  Temp(Src) 97.4 F  (36.3 C) (Oral)  Resp 12  SpO2 98% Physical Exam  Constitutional: She is oriented to person, place, and time. She appears well-developed and well-nourished. No distress.  HENT:  Head: Normocephalic and atraumatic.  Mouth/Throat: Oropharynx is clear and moist.  Cranial nerves II through XII grossly intact  Eyes: EOM are normal. Pupils are equal, round, and reactive to light.  Neck: Normal range of motion. Neck supple.  Cardiovascular: Normal rate and regular rhythm.  Exam reveals no gallop and no friction rub.   No murmur heard. Pulmonary/Chest: Effort normal and breath sounds normal. No respiratory distress. She has no wheezes. She has no rales. She exhibits no tenderness.  Abdominal: Soft. Bowel sounds are normal. She exhibits no distension and no mass. There is no tenderness. There is no rebound and no guarding.  Musculoskeletal: Normal range of motion. She exhibits no edema or tenderness.  Neurological: She is alert and oriented to person, place, and time.  Mild decrease in grip strength of the left hand compared to the right. Sensation to light touch equal throughout. Bilateral finger to nose intact. Clear speech.   Skin: Skin is warm and dry. No rash noted. No erythema.  Psychiatric: She has a normal mood and affect. Her behavior is normal.  Nursing note and vitals reviewed.   ED Course  Procedures (including critical care time) Labs Review Labs Reviewed  DIFFERENTIAL - Abnormal; Notable for the following:    Eosinophils Relative 8 (*)    All other components within normal limits  I-STAT CHEM 8, ED - Abnormal; Notable for the following:    Glucose, Bld 102 (*)    All other components within normal limits  PROTIME-INR  APTT  CBC  ETHANOL  COMPREHENSIVE METABOLIC PANEL  URINE RAPID DRUG SCREEN (HOSP PERFORMED)  URINALYSIS, ROUTINE W REFLEX MICROSCOPIC  I-STAT TROPOININ, ED    Imaging Review Ct Head Wo Contrast  09/24/2014   CLINICAL DATA:  Initial evaluation for acute  left arm weakness.  EXAM: CT HEAD WITHOUT CONTRAST  TECHNIQUE: Contiguous axial images were obtained from the base of the skull through the vertex without intravenous contrast.  COMPARISON:  None.  FINDINGS: There is no acute intracranial hemorrhage or infarct. No mass lesion or midline shift. Gray-white matter differentiation is well maintained. Ventricles are normal in size without evidence of hydrocephalus. CSF containing spaces are within normal limits. No extra-axial fluid collection.  Mild chronic small vessel ischemic changes present.  The calvarium is intact.  Orbital soft tissues are within normal limits.  The paranasal sinuses and mastoid air cells are well pneumatized and free of fluid.  Scalp soft tissues are unremarkable.  IMPRESSION: 1. No acute intracranial process. 2. Mild chronic microvascular ischemic disease.   Electronically Signed   By: Jeannine Boga M.D.   On: 09/24/2014 01:02     EKG Interpretation  Date/Time:  Tuesday Sep 23 2014 23:52:44 EDT Ventricular Rate:  79 PR Interval:  158 QRS Duration: 102 QT Interval:  402 QTC Calculation: 461 R Axis:   84 Text Interpretation:  Sinus rhythm Borderline right axis deviation  Confirmed by Lita Mains  MD, Tiago Humphrey ([id number]) on 09/24/2014 1:14:09 AM      MDM   Final diagnoses:  Right arm weakness    Code stroke initiated on my initial evaluation the patient. Discussed with Dr. Armida Sans and given the almost complete resolution of her symptoms the patient will not be candidate for TPA. She will require transfer to [facility] for further workup and admission. Discussed with Dr. Ralene Bathe who accepts the patient in transfer. Patient CT head without any acute findings. She continues to be hemodynamically stable.     Julianne Rice, MD 09/24/14 [phone]

## 2014-09-24 NOTE — Progress Notes (Signed)
Attempted to receive report from ED. Joaquin Bend E, RN 09/24/2014 3:21 AM

## 2014-09-24 NOTE — Evaluation (Signed)
Physical Therapy Evaluation Patient Details Name: Shannon Collins MRN: [id number] DOB: 1950/09/13 Today's Date: 09/24/2014   History of Present Illness  Patient is a 64 yo female whoe presented with LUE weakness and numbness. MRI revealed patchy and linear acute ischemic infarcts involving the posterior right frontal lobe.   Clinical Impression  Patient mobilizing well, no significant focal deficits during mobility. No further acute PT needs, will sign off.    Follow Up Recommendations No PT follow up    Equipment Recommendations  None recommended by PT    Recommendations for Other Services       Precautions / Restrictions        Mobility  Bed Mobility Overal bed mobility: Independent                Transfers Overall transfer level: Independent                  Ambulation/Gait Ambulation/Gait assistance: Independent Ambulation Distance (Feet): 240 Feet Assistive device: None Gait Pattern/deviations: WFL(Within Functional Limits)     General Gait Details: steady with gait despite baseline arthritic changes at ankles  Stairs Stairs: Yes Stairs assistance: Modified independent (Device/Increase time) Stair Management: One rail Right Number of Stairs: 5 General stair comments: no physical assist of significant deviaitions  Wheelchair Mobility    Modified Rankin (Stroke Patients Only) Modified Rankin (Stroke Patients Only) Pre-Morbid Rankin Score: No symptoms Modified Rankin: Moderate disability     Balance                                 Standardized Balance Assessment Standardized Balance Assessment : Dynamic Gait Index   Dynamic Gait Index Level Surface: Normal Change in Gait Speed: Normal Gait with Horizontal Head Turns: Normal Gait with Vertical Head Turns: Normal Gait and Pivot Turn: Mild Impairment Step Over Obstacle: Normal Step Around Obstacles: Normal Steps: Mild Impairment Total Score: 22       Pertinent  Vitals/Pain Pain Assessment: No/denies pain    Home Living Family/patient expects to be discharged to:: Private residence Living Arrangements: Spouse/significant other Available Help at Discharge: Family Type of Home: House Home Access: Level entry     Home Layout: Two level Home Equipment: None Additional Comments: tub shower, elevated toilets    Prior Function Level of Independence: Independent               Hand Dominance   Dominant Hand: Right    Extremity/Trunk Assessment   Upper Extremity Assessment: Defer to OT evaluation           Lower Extremity Assessment: Overall WFL for tasks assessed         Communication   Communication:  (glasses all the time)  Cognition Arousal/Alertness: Awake/alert Behavior During Therapy: WFL for tasks assessed/performed Overall Cognitive Status: Within Functional Limits for tasks assessed                      General Comments      Exercises        Assessment/Plan    PT Assessment Patent does not need any further PT services  PT Diagnosis Difficulty walking   PT Problem List    PT Treatment Interventions     PT Goals (Current goals can be found in the Care Plan section) Acute Rehab PT Goals PT Goal Formulation: All assessment and education complete, DC therapy    Frequency  Barriers to discharge        Co-evaluation               End of Session   Activity Tolerance: Patient tolerated treatment well;No increased pain Patient left: in bed;with call bell/phone within reach;with family/visitor present Nurse Communication: Mobility status         Time: 1011-1027 PT Time Calculation (min) (ACUTE ONLY): 16 min   Charges:   PT Evaluation $Initial PT Evaluation Tier I: 1 Procedure     PT G CodesDuncan Dull 2014/10/08, 10:30 AM Alben Deeds, PT DPT  [phone]

## 2014-09-24 NOTE — Progress Notes (Signed)
Pt. Arrived from ED with tech with VSS and reporting no pain. Will continue to monitor.  Shannon Collins E, [place] 09/24/2014 0430

## 2014-09-24 NOTE — Consult Note (Addendum)
Referring Physician: Dr. Lita Mains    Chief Complaint: left arm weakness and numbness  HPI:                                                                                                                                         Shannon Collins is an 64 y.o. female with a past medical history significant for hypercholesterolemia, thyroid disease, asthma, and RA, who was in her usual state of health until 10:30 or 11 pm yesterday when developed sudden onset of weakness and numbness of the left arm, " like my arm was dead". Stated that she never had similar symptoms before .Said that when she weakness became apparent she couldn't to hold a glass of water unless she use both hands, and was even not capable of opening a door with his left hand. She went to her bedroom and woke up her husband who noticed that she had obviouos weakness of the left arm but he did not notice weakness of the left leg or face, slurrred speech, confusion, or imbalance. Patient denied associated HA, vertigo, double vision, language or vision impairment. By the time she presented to [facility] her symptoms were already resolving (NIHSS 1) and hence she was not considered a proper candidate for thrombolysis. CT brain was personally reviewed and revealed no acute abnormality. Presently, she said that she is remarkably better but doesn't believe she is back to baseline yet.  Date last known well: 5/3/1 Time last known well: 10:30 pm tPA Given: no, symptoms remarkably improved NIHSS: 1 at the time of initial evaluation at [facility], but 0 at this time. MRS: 0  Past Medical History  Diagnosis Date  . Asthma   . Rheumatoid arthritis   . High cholesterol   . Thyroid condition     Past Surgical History  Procedure Laterality Date  . Foot surgery      x2  . Cesarean section      Family History  Problem Relation Age of Onset  . Asthma Father    Social History:  reports that she has never smoked. She does not have any smokeless  tobacco history on file. She reports that she does not drink alcohol or use illicit drugs. Family history: no brain tumors, epilepsy, MS, or brain aneurysms. Allergies:  Allergies  Allergen Reactions  . Azithromycin     rash  . Biaxin [Clarithromycin]     "does not remember reaction"  . Codeine     Feels like she will "pass out"    Medications:  I have reviewed the patient's current medications.  ROS:                                                                                                                                       History obtained from the patient, husband, and chart review  General ROS: negative for - chills, fatigue, fever, night sweats, weight gain or weight loss Psychological ROS: negative for - behavioral disorder, hallucinations, memory difficulties, mood swings or suicidal ideation Ophthalmic ROS: negative for - blurry vision, double vision, eye pain or loss of vision ENT ROS: negative for - epistaxis, nasal discharge, oral lesions, sore throat, tinnitus or vertigo Allergy and Immunology ROS: negative for - hives or itchy/watery eyes Hematological and Lymphatic ROS: negative for - bleeding problems, bruising or swollen lymph nodes Endocrine ROS: negative for - galactorrhea, hair pattern changes, polydipsia/polyuria or temperature intolerance Respiratory ROS: negative for - cough, hemoptysis, shortness of breath or wheezing Cardiovascular ROS: negative for - chest pain, dyspnea on exertion, edema or irregular heartbeat Gastrointestinal ROS: negative for - abdominal pain, diarrhea, hematemesis, nausea/vomiting or stool incontinence Genito-Urinary ROS: negative for - dysuria, hematuria, incontinence or urinary frequency/urgency Musculoskeletal ROS: negative for - joint swelling Neurological ROS: as noted in HPI Dermatological ROS: negative  for rash and skin lesion changes   Physical exam: pleasant female in no apparent distress. Blood pressure 138/69, pulse 77, temperature 97.4 F (36.3 C), temperature source Oral, resp. rate 12, SpO2 98 %. Head: normocephalic. Neck: supple, no bruits, no JVD. Cardiac: no murmurs. Lungs: clear. Abdomen: soft, no tender, no mass. Extremities: no edema, clubbing, or cyanosis. CV: pulses palpable throughout  Skin: no rash Neurologic Examination:                                                                                                      General: Mental Status: Alert, oriented, thought content appropriate.  Speech fluent without evidence of aphasia.  Able to follow 3 step commands without difficulty. Cranial Nerves: II: Discs flat bilaterally; Visual fields grossly normal, pupils equal, round, reactive to light and accommodation III,IV, VI: ptosis not present, extra-ocular motions intact bilaterally V,VII: smile symmetric, facial light touch sensation normal bilaterally VIII: hearing normal bilaterally IX,X: uvula rises symmetrically XI: bilateral shoulder shrug XII: midline tongue extension without atrophy or fasciculations Motor: Significant for subtle weakness left wrist extensors. Tone and bulk:normal tone throughout; no atrophy noted Sensory: Pinprick and light touch intact throughout, bilaterally Deep Tendon Reflexes:  Right: Upper Extremity  Left: Upper extremity   biceps (C-5 to C-6) 2/4   biceps (C-5 to C-6) 2/4 tricep (C7) 2/4    triceps (C7) 2/4 Brachioradialis (C6) 2/4  Brachioradialis (C6) 2/4  Lower Extremity Lower Extremity  quadriceps (L-2 to L-4) 2/4   quadriceps (L-2 to L-4) 2/4 Achilles (S1) 2/4   Achilles (S1) 2/4  Plantars: Right: downgoing   Left: downgoing Cerebellar: normal finger-to-nose,  normal heel-to-shin test Gait:  No tested due to multiple leads    Results for orders placed or performed during the hospital encounter of 09/23/14 (from  the past 48 hour(s))  Ethanol     Status: None   Collection Time: 09/24/14 12:27 AM  Result Value Ref Range   Alcohol, Ethyl (B) <5 <5 mg/dL    Comment:        LOWEST DETECTABLE LIMIT FOR SERUM ALCOHOL IS 11 mg/dL FOR MEDICAL PURPOSES ONLY   Protime-INR     Status: None   Collection Time: 09/24/14 12:27 AM  Result Value Ref Range   Prothrombin Time 12.4 11.6 - 15.2 seconds   INR 0.91 0.00 - 1.49  APTT     Status: None   Collection Time: 09/24/14 12:27 AM  Result Value Ref Range   aPTT 36 24 - 37 seconds  CBC     Status: None   Collection Time: 09/24/14 12:27 AM  Result Value Ref Range   WBC 6.2 4.0 - 10.5 K/uL   RBC 4.44 3.87 - 5.11 MIL/uL   Hemoglobin 12.5 12.0 - 15.0 g/dL   HCT 39.2 36.0 - 46.0 %   MCV 88.3 78.0 - 100.0 fL   MCH 28.2 26.0 - 34.0 pg   MCHC 31.9 30.0 - 36.0 g/dL   RDW 14.2 11.5 - 15.5 %   Platelets 236 150 - 400 K/uL  Differential     Status: Abnormal   Collection Time: 09/24/14 12:27 AM  Result Value Ref Range   Neutrophils Relative % 48 43 - 77 %   Neutro Abs 3.0 1.7 - 7.7 K/uL   Lymphocytes Relative 35 12 - 46 %   Lymphs Abs 2.2 0.7 - 4.0 K/uL   Monocytes Relative 8 3 - 12 %   Monocytes Absolute 0.5 0.1 - 1.0 K/uL   Eosinophils Relative 8 (H) 0 - 5 %   Eosinophils Absolute 0.5 0.0 - 0.7 K/uL   Basophils Relative 1 0 - 1 %   Basophils Absolute 0.1 0.0 - 0.1 K/uL  Comprehensive metabolic panel     Status: Abnormal   Collection Time: 09/24/14 12:27 AM  Result Value Ref Range   Sodium 140 135 - 145 mmol/L   Potassium 3.4 (L) 3.5 - 5.1 mmol/L   Chloride 108 101 - 111 mmol/L   CO2 28 22 - 32 mmol/L   Glucose, Bld 101 (H) 70 - 99 mg/dL   BUN 15 6 - 20 mg/dL   Creatinine, Ser 0.79 0.44 - 1.00 mg/dL   Calcium 9.4 8.9 - 10.3 mg/dL   Total Protein 7.6 6.5 - 8.1 g/dL   Albumin 4.2 3.5 - 5.0 g/dL   AST 46 (H) 15 - 41 U/L   ALT 53 14 - 54 U/L   Alkaline Phosphatase 94 38 - 126 U/L   Total Bilirubin 0.5 0.3 - 1.2 mg/dL   GFR calc non Af Amer >60 >60  mL/min   GFR calc Af Amer >60 >60 mL/min    Comment: (NOTE) The eGFR has been calculated using the CKD EPI  equation. This calculation has not been validated in all clinical situations. eGFR's persistently <90 mL/min signify possible Chronic Kidney Disease.    Anion gap 4 (L) 5 - 15  Urine Drug Screen     Status: None   Collection Time: 09/24/14 12:44 AM  Result Value Ref Range   Opiates NONE DETECTED NONE DETECTED   Cocaine NONE DETECTED NONE DETECTED   Benzodiazepines NONE DETECTED NONE DETECTED   Amphetamines NONE DETECTED NONE DETECTED   Tetrahydrocannabinol NONE DETECTED NONE DETECTED   Barbiturates NONE DETECTED NONE DETECTED    Comment:        DRUG SCREEN FOR MEDICAL PURPOSES ONLY.  IF CONFIRMATION IS NEEDED FOR ANY PURPOSE, NOTIFY LAB WITHIN 5 DAYS.        LOWEST DETECTABLE LIMITS FOR URINE DRUG SCREEN Drug Class       Cutoff (ng/mL) Amphetamine      1000 Barbiturate      200 Benzodiazepine   540 Tricyclics       981 Opiates          300 Cocaine          300 THC              50   Urinalysis, Routine w reflex microscopic     Status: Abnormal   Collection Time: 09/24/14 12:44 AM  Result Value Ref Range   Color, Urine YELLOW YELLOW   APPearance CLEAR CLEAR   Specific Gravity, Urine 1.002 (L) 1.005 - 1.030   pH 6.5 5.0 - 8.0   Glucose, UA NEGATIVE NEGATIVE mg/dL   Hgb urine dipstick NEGATIVE NEGATIVE   Bilirubin Urine NEGATIVE NEGATIVE   Ketones, ur NEGATIVE NEGATIVE mg/dL   Protein, ur NEGATIVE NEGATIVE mg/dL   Urobilinogen, UA 0.2 0.0 - 1.0 mg/dL   Nitrite NEGATIVE NEGATIVE   Leukocytes, UA TRACE (A) NEGATIVE  Urine microscopic-add on     Status: None   Collection Time: 09/24/14 12:44 AM  Result Value Ref Range   Squamous Epithelial / LPF RARE RARE   WBC, UA 0-2 <3 WBC/hpf   Bacteria, UA RARE RARE  I-Stat Chem 8, ED  (not at [facility], [facility])     Status: Abnormal   Collection Time: 09/24/14 12:45 AM  Result Value Ref Range   Sodium 140 135 - 145 mmol/L    Potassium 3.5 3.5 - 5.1 mmol/L   Chloride 106 101 - 111 mmol/L   BUN 16 6 - 20 mg/dL   Creatinine, Ser 0.80 0.44 - 1.00 mg/dL   Glucose, Bld 102 (H) 70 - 99 mg/dL   Calcium, Ion 1.20 1.13 - 1.30 mmol/L   TCO2 22 0 - 100 mmol/L   Hemoglobin 13.6 12.0 - 15.0 g/dL   HCT 40.0 36.0 - 46.0 %  I-stat troponin, ED (not at [facility], [facility])     Status: None   Collection Time: 09/24/14 12:54 AM  Result Value Ref Range   Troponin i, poc 0.00 0.00 - 0.08 ng/mL   Comment 3            Comment: Due to the release kinetics of cTnI, a negative result within the first hours of the onset of symptoms does not rule out myocardial infarction with certainty. If myocardial infarction is still suspected, repeat the test at appropriate intervals.    Ct Head Wo Contrast  09/24/2014   CLINICAL DATA:  Initial evaluation for acute left arm weakness.  EXAM: CT HEAD WITHOUT CONTRAST  TECHNIQUE: Contiguous axial images were obtained  from the base of the skull through the vertex without intravenous contrast.  COMPARISON:  None.  FINDINGS: There is no acute intracranial hemorrhage or infarct. No mass lesion or midline shift. Gray-white matter differentiation is well maintained. Ventricles are normal in size without evidence of hydrocephalus. CSF containing spaces are within normal limits. No extra-axial fluid collection.  Mild chronic small vessel ischemic changes present.  The calvarium is intact.  Orbital soft tissues are within normal limits.  The paranasal sinuses and mastoid air cells are well pneumatized and free of fluid.  Scalp soft tissues are unremarkable.  IMPRESSION: 1. No acute intracranial process. 2. Mild chronic microvascular ischemic disease.   Electronically Signed   By: Jeannine Boga M.D.   On: 09/24/2014 01:02     Assessment: 64 y.o. female with new onset left arm weakness-numbness that is significantly improved at this time. She had initial NIHSS 1 upon evaluation at [facility] and thus thrombolysis was not  administered. NIHSS 0 at this moment ,although subtle weakness fingers flexors left hand. CT brain without acute abnormality. Suspect small right brain infarct versus TIA. Admit to medicine.Ordered complete TIA/stroke work up.  Aspirin after passing swallowing evaluation. Stroke team will follow up tomorrow.   Stroke Risk Factors - hyperlipidemia.  Plan: 1. HgbA1c, fasting lipid panel 2. MRI, MRA  of the brain without contrast 3. Echocardiogram 4. Carotid dopplers 5. Prophylactic therapy-aspirin after passing swallowing evaluation 6. Risk factor modification 7. Telemetry monitoring 8. Frequent neuro checks 9. PT/OT SLP  Dorian Pod, MD [facility] [phone]  09/24/2014, 2:33 AM

## 2014-09-24 NOTE — Progress Notes (Signed)
STROKE TEAM PROGRESS NOTE   HISTORY Shannon Collins is an 64 y.o. female with a past medical history significant for hypercholesterolemia, thyroid disease, asthma, and RA, who was in her usual state of health until 10:30 or 11 pm yesterday when developed sudden onset of weakness and numbness of the left arm, " like my arm was dead". Stated that she never had similar symptoms before. Said that when she weakness became apparent she couldn't to hold a glass of water unless she use both hands, and was even not capable of opening a door with her left hand. She went to her bedroom and woke up her husband who noticed that she had obviouos weakness of the left arm but he did not notice weakness of the left leg or face, slurrred speech, confusion, or imbalance. Patient denied associated HA, vertigo, double vision, language or vision impairment. By the time she presented to [facility] her symptoms were already resolving (NIHSS 1) and hence she was not considered a proper candidate for thrombolysis. CT brain was reviewed and revealed no acute abnormality. She said that she is remarkably better but doesn't believe she is back to baseline yet. She was last known well 5/3/1 At 10:30 pm. NIHSS: 1 at the time of initial evaluation at [facility], but 0 at this time. MRS: 0. Patient was not administered TPA secondary to symptoms remarkably improved. She was admitted for further evaluation and treatment.   SUBJECTIVE (INTERVAL HISTORY) Her husband is at the bedside.  Overall she feels her condition is rapidly improving. PT just finished seeing her.   OBJECTIVE Temp:  [97.4 F (36.3 C)-98 F (36.7 C)] 97.8 F (36.6 C) (05/04 1003) Pulse Rate:  [62-80] 76 (05/04 1003) Cardiac Rhythm:  [-] Normal sinus rhythm (05/04 0810) Resp:  [12-16] 16 (05/04 1003) BP: (120-139)/(59-85) 120/59 mmHg (05/04 1003) SpO2:  [95 %-100 %] 95 % (05/04 1003) Weight:  [61.372 kg (135 lb 4.8 oz)] 61.372 kg (135 lb 4.8 oz) (05/04 0500)  No results  for input(s): GLUCAP in the last 168 hours.  Recent Labs Lab 09/24/14 0027 09/24/14 0045  NA 140 140  K 3.4* 3.5  CL 108 106  CO2 28  --   GLUCOSE 101* 102*  BUN 15 16  CREATININE 0.79 0.80  CALCIUM 9.4  --     Recent Labs Lab 09/24/14 0027  AST 46*  ALT 53  ALKPHOS 94  BILITOT 0.5  PROT 7.6  ALBUMIN 4.2    Recent Labs Lab 09/24/14 0027 09/24/14 0045  WBC 6.2  --   NEUTROABS 3.0  --   HGB 12.5 13.6  HCT 39.2 40.0  MCV 88.3  --   PLT 236  --    No results for input(s): CKTOTAL, CKMB, CKMBINDEX, TROPONINI in the last 168 hours.  Recent Labs  09/24/14 0027  LABPROT 12.4  INR 0.91    Recent Labs  09/24/14 0044  COLORURINE YELLOW  LABSPEC 1.002*  PHURINE 6.5  GLUCOSEU NEGATIVE  HGBUR NEGATIVE  BILIRUBINUR NEGATIVE  KETONESUR NEGATIVE  PROTEINUR NEGATIVE  UROBILINOGEN 0.2  NITRITE NEGATIVE  LEUKOCYTESUR TRACE*       Component Value Date/Time   CHOL 169 09/24/2014 0732   TRIG 77 09/24/2014 0732   HDL 58 09/24/2014 0732   CHOLHDL 2.9 09/24/2014 0732   VLDL 15 09/24/2014 0732   LDLCALC 96 09/24/2014 0732   No results found for: HGBA1C    Component Value Date/Time   LABOPIA NONE DETECTED 09/24/2014 0044   COCAINSCRNUR  NONE DETECTED 09/24/2014 0044   LABBENZ NONE DETECTED 09/24/2014 0044   AMPHETMU NONE DETECTED 09/24/2014 0044   THCU NONE DETECTED 09/24/2014 0044   LABBARB NONE DETECTED 09/24/2014 0044     Recent Labs Lab 09/24/14 0027  ETH <5    Ct Head Wo Contrast 09/24/2014   1. No acute intracranial process. 2. Mild chronic microvascular ischemic disease.     MRI HEAD  09/24/2014   1. Small patchy and linear acute ischemic infarcts involving the cortical gray matter and underlying deep white matter of the posterior right frontal lobe as above. No associated hemorrhage or significant mass effect. 2. Mild chronic small vessel ischemic disease.    MRA HEAD  09/24/2014    1. No proximal branch occlusion or hemodynamically significant  stenosis identified within the intracranial circulation. 2. Diminutive basilar artery, with the posterior cerebral arteries supplied in large part be a prominent posterior communicating arteries bilaterally.      PHYSICAL EXAM Pleasant middle aged caucasian lady not in distress. . Afebrile. Head is nontraumatic. Neck is supple without bruit.    Cardiac exam no murmur or gallop. Lungs are clear to auscultation. Distal pulses are well felt. Neurological Exam ;  Awake  Alert oriented x 3. Normal speech and language.eye movements full without nystagmus.fundi were not visualized. Vision acuity and fields appear normal. Hearing is normal. Palatal movements are normal. Face symmetric. Tongue midline. Normal strength, tone, reflexes and coordination. Normal sensation. Gait deferred.  ASSESSMENT/PLAN Shannon Collins is a 64 y.o. female with history of hypercholesterolemia, thyroid disease, asthma, and RA presenting with weakness and numbness of the left arm. She did not receive IV t-PA due to improvement in symptoms.   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 Arranged with [place] for tomorrow.  If positive for PFO (patent foramen ovale), check bilateral lower extremity venous dopplers to rule out DVT as possible source of stroke. (I have made patient NPO after midnight tonight). If TEE negative, a [place] electrophysiologist will consult and consider placement of an implantable loop recorder to evaluate for atrial fibrillation as etiology of stroke. This has been explained to patient/husband by Dr. Leonie Man and they are agreeable.   Heparin 5000 units sq tid for VTE prophylaxis Diet Heart Room service appropriate?:  Yes; Fluid consistency:: Thin  aspirin 325 mg orally every day prior to admission, now on aspirin 325 mg orally every day. Given stroke on aspirin, will change to plavix 75 mg daily  Patient counseled to be compliant with her antithrombotic medications  Ongoing aggressive stroke risk factor management  Therapy recommendations:  No PT   Disposition:  return home  Hypertension  Stable  Hyperlipidemia  Home meds:  lipitor 10 and fish oil, lipitor resumed in hospital  LDL 96, goal < 70  Will increase lipitor  Continue statin at discharge  Other Stroke Risk Factors  Family hx TIA (father). Thinks her dad also has atrial fibrillation, he is on coumadin  Other Active Problems  Asthma  RA  thyroid  Hospital day #   [place] for Pager information 09/24/2014 10:30 AM  I have personally examined this patient, reviewed notes,  independently viewed imaging studies, participated in medical decision making and plan of care. I have made any additions or clarifications directly to the above note. Agree with note above. She presented with transient left arm weakness and numbness from an embolic right frontal MCA branch infarct. She remains at risk for neurological worsening, recurrent stroke and TIA and needs ongoing stroke evaluation and aggressive risk factor modification. Long discussion with the patient and husband and the bedside regarding the stroke as well as need for TEE and loop recorder implant. Change aspirin to Plavix for secondary stroke prevention.  Antony Contras, MD Medical Director [facility] Pager: [phone] 09/24/2014 3:23 PM    To contact Stroke Continuity provider, please refer to [email or web address] After hours, contact General Neurology

## 2014-09-24 NOTE — ED Provider Notes (Signed)
Pt is improving Seen by neuro dr Armida Sans Recommends admit to medicine D/w dr gardner will admit to tele   Ripley Fraise, MD 09/24/14 [phone]

## 2014-09-24 NOTE — Progress Notes (Signed)
    [facility] has been requested to perform a transesophageal echocardiogram on Shannon Collins for stroke workup.  After careful review of history and examination, the risks and benefits of transesophageal echocardiogram have been explained including risks of esophageal damage, perforation (1:10,000 risk), bleeding, pharyngeal hematoma as well as other potential complications associated with conscious sedation including aspiration, arrhythmia, respiratory failure and death. Alternatives to treatment were discussed, questions were answered. Patient is willing to proceed.   Loop recorder implant was also discussed.   [name], [place] 09/24/2014 1:15 PM

## 2014-09-24 NOTE — Progress Notes (Signed)
VASCULAR LAB PRELIMINARY  PRELIMINARY  PRELIMINARY  PRELIMINARY  Carotid Duplex completed.    Preliminary report: Carotid Duplex shows calcified plaque in the proximal right ICA at the carotid bifurcation.  Velocities are within the 1-39% range of stenosis bilaterally.  Vertebral arteries are patent and antegrade bilaterally.  August Albino, RVT 09/24/2014, 11:43 AM

## 2014-09-24 NOTE — ED Notes (Signed)
Patient transported to CT 

## 2014-09-24 NOTE — H&P (Signed)
[facility] History and Physical  Shannon Collins:[id number] DOB: [birth year] DOA: 09/23/2014  Referring physician: EDP PCP: Tawanna Solo, MD   Chief Complaint: Left arm weakness   HPI: Shannon Collins is a 64 y.o. female with PMH of RA, HLD, presents to ED after developing sudden onset of weakness and numbness of the left arm at 11pm yesterday.  Never had similar symptoms previously, weakness was such that she couldn't hold a glass of water or open a door with left hand.  No slurred speech, confusion, facial droop, or leg weakness.  Patient brought to ED at [facility] by husband.  Weakness has improved since arrival in ED, now she has only a small amount of weakness remaining but isnt back to baseline yet.  Review of Systems: Systems reviewed.  As above, otherwise negative  Past Medical History  Diagnosis Date  . Asthma   . Rheumatoid arthritis   . High cholesterol   . Thyroid condition    Past Surgical History  Procedure Laterality Date  . Foot surgery      x2  . Cesarean section     Social History:  reports that she has never smoked. She does not have any smokeless tobacco history on file. She reports that she does not drink alcohol or use illicit drugs.  Allergies  Allergen Reactions  . Azithromycin     rash  . Biaxin [Clarithromycin]     "does not remember reaction"  . Codeine     Feels like she will "pass out"  . Tramadol Other (See Comments)    Feels as though I will pass out    Family History  Problem Relation Age of Onset  . Asthma Father      Prior to Admission medications   Medication Sig Start Date End Date Taking? Authorizing Provider  albuterol (PROVENTIL HFA;VENTOLIN HFA) 108 (90 BASE) MCG/ACT inhaler Inhale 2 puffs into the lungs every 6 (six) hours as needed for wheezing or shortness of breath.   Yes Historical Provider, MD  aspirin 325 MG tablet Take 325 mg by mouth daily.   Yes Historical Provider, MD  atorvastatin (LIPITOR) 10 MG tablet  Take 10 mg by mouth daily.   Yes Historical Provider, MD  beta carotene w/minerals (OCUVITE) tablet Take 1 tablet by mouth daily.   Yes Historical Provider, MD  calcium carbonate (OS-CAL) 600 MG TABS tablet Take 1,200 mg by mouth daily.   Yes Historical Provider, MD  Cholecalciferol (VITAMIN D) 2000 UNITS CAPS Take 2,000 Units by mouth daily.    Yes Historical Provider, MD  cycloSPORINE (RESTASIS) 0.05 % ophthalmic emulsion Place 1 drop into both eyes 2 (two) times daily.    Yes Historical Provider, MD  GRAPE SEED CR PO Take 1 tablet by mouth daily.    Yes Historical Provider, MD  HUMIRA 40 MG/0.8ML PSKT Inject 40 mg into the skin every 7 (seven) days. Inject into skin once every other week 07/09/14  Yes Historical Provider, MD  levothyroxine (SYNTHROID, LEVOTHROID) 50 MCG tablet Take 50 mcg by mouth daily before breakfast.   Yes Historical Provider, MD  mometasone (NASONEX) 50 MCG/ACT nasal spray Place into the nose daily.   Yes Historical Provider, MD  Multiple Vitamin (MULTIVITAMIN) tablet Take 1 tablet by mouth daily.   Yes Historical Provider, MD  Omega-3 Fatty Acids (FISH OIL) 1000 MG CAPS Take 4,000 capsules by mouth daily.   Yes Historical Provider, MD  omeprazole (PRILOSEC) 40 MG capsule Take 1 capsule (40 mg  total) by mouth 2 (two) times daily. 07/29/14  Yes Kathee Delton, MD  potassium gluconate 595 MG TABS tablet Take 595 mg by mouth daily.   Yes Historical Provider, MD  SEROQUEL XR 50 MG TB24 24 hr tablet Take 75 mg by mouth at bedtime.  07/14/14  Yes Historical Provider, MD  vitamin C (ASCORBIC ACID) 500 MG tablet Take 500 mg by mouth daily.   Yes Historical Provider, MD  vitamin E 400 UNIT capsule Take 400 Units by mouth daily.   Yes Historical Provider, MD   Physical Exam: Filed Vitals:   09/24/14 0108  BP: 138/69  Pulse: 77  Temp: 97.4 F (36.3 C)  Resp: 12    BP 138/69 mmHg  Pulse 77  Temp(Src) 97.4 F (36.3 C) (Oral)  Resp 12  SpO2 98%  General Appearance:    Alert,  oriented, no distress, appears stated age  Head:    Normocephalic, atraumatic  Eyes:    PERRL, EOMI, sclera non-icteric        Nose:   Nares without drainage or epistaxis. Mucosa, turbinates normal  Throat:   Moist mucous membranes. Oropharynx without erythema or exudate.  Neck:   Supple. No carotid bruits.  No thyromegaly.  No lymphadenopathy.   Back:     No CVA tenderness, no spinal tenderness  Lungs:     Clear to auscultation bilaterally, without wheezes, rhonchi or rales  Chest wall:    No tenderness to palpitation  Heart:    Regular rate and rhythm without murmurs, gallops, rubs  Abdomen:     Soft, non-tender, nondistended, normal bowel sounds, no organomegaly  Genitalia:    deferred  Rectal:    deferred  Extremities:   No clubbing, cyanosis or edema.  Pulses:   2+ and symmetric all extremities  Skin:   Skin color, texture, turgor normal, no rashes or lesions  Lymph nodes:   Cervical, supraclavicular, and axillary nodes normal  Neurologic:   Left arm 4/5 strength, 5/5 throughout otherwise.    Labs on Admission:  Basic Metabolic Panel:  Recent Labs Lab 09/24/14 0027 09/24/14 0045  NA 140 140  K 3.4* 3.5  CL 108 106  CO2 28  --   GLUCOSE 101* 102*  BUN 15 16  CREATININE 0.79 0.80  CALCIUM 9.4  --    Liver Function Tests:  Recent Labs Lab 09/24/14 0027  AST 46*  ALT 53  ALKPHOS 94  BILITOT 0.5  PROT 7.6  ALBUMIN 4.2   No results for input(s): LIPASE, AMYLASE in the last 168 hours. No results for input(s): AMMONIA in the last 168 hours. CBC:  Recent Labs Lab 09/24/14 0027 09/24/14 0045  WBC 6.2  --   NEUTROABS 3.0  --   HGB 12.5 13.6  HCT 39.2 40.0  MCV 88.3  --   PLT 236  --    Cardiac Enzymes: No results for input(s): CKTOTAL, CKMB, CKMBINDEX, TROPONINI in the last 168 hours.  BNP (last 3 results) No results for input(s): PROBNP in the last 8760 hours. CBG: No results for input(s): GLUCAP in the last 168 hours.  Radiological Exams on  Admission: Ct Head Wo Contrast  09/24/2014   CLINICAL DATA:  Initial evaluation for acute left arm weakness.  EXAM: CT HEAD WITHOUT CONTRAST  TECHNIQUE: Contiguous axial images were obtained from the base of the skull through the vertex without intravenous contrast.  COMPARISON:  None.  FINDINGS: There is no acute intracranial hemorrhage or infarct. No mass  lesion or midline shift. Gray-white matter differentiation is well maintained. Ventricles are normal in size without evidence of hydrocephalus. CSF containing spaces are within normal limits. No extra-axial fluid collection.  Mild chronic small vessel ischemic changes present.  The calvarium is intact.  Orbital soft tissues are within normal limits.  The paranasal sinuses and mastoid air cells are well pneumatized and free of fluid.  Scalp soft tissues are unremarkable.  IMPRESSION: 1. No acute intracranial process. 2. Mild chronic microvascular ischemic disease.   Electronically Signed   By: Jeannine Boga M.D.   On: 09/24/2014 01:02    EKG: Independently reviewed.  Assessment/Plan Active Problems:   TIA (transient ischemic attack)   1. TIA - 1. Stroke pathway and work up ordered 2. Dr. Armida Sans has seen patient, please see his note 3. Continue home ASA 325 for now    Code Status: Full Code  Family Communication: Husband at bedside Disposition Plan: Admit to inpatient.   Time spent: 70 min  GARDNER, JARED M. [facility] Pager [phone]  If 7AM-7PM, please contact the day team taking care of the patient [email or web address] Password [email or web address] 09/24/2014, 3:15 AM

## 2014-09-24 NOTE — Progress Notes (Signed)
  Echocardiogram 2D Echocardiogram has been performed.  Minus Breeding A 09/24/2014, 11:59 AM

## 2014-09-24 NOTE — Progress Notes (Signed)
[facility]                                                                              Patient Demographics  Shannon Collins, is a 64 y.o. female, DOB - Jan 20, 1951, AST:[id number]  Admit date - 09/23/2014   Admitting Physician Etta Quill, DO  Outpatient Primary MD for the patient is Tawanna Solo, MD  LOS -    Chief Complaint  Patient presents with  . Numbness      HPI on 09/24/2014 by Dr. Jennette Kettle Shannon Collins is a 64 y.o. female with PMH of RA, HLD, presents to ED after developing sudden onset of weakness and numbness of the left arm at 11pm yesterday. Never had similar symptoms previously, weakness was such that she couldn't hold a glass of water or open a door with left hand. No slurred speech, confusion, facial droop, or leg weakness. Patient brought to ED at [facility] by husband. Weakness has improved since arrival in ED, now she has only a small amount of weakness remaining but isnt back to baseline yet.  Assessment & Plan  Patient admitted this morning by Dr. Jennette Kettle.  Agree with current assessment and plan.  Acute CVA -CT head: No acute intracranial process -MRI brain: Small patchy and linear acute ischemic infarcts involving cortical gray matter and posterior right frontal lobe -Echocardiogram: EF 98-92%, grade 1 diastolic dysfunction -Carotid doppler: Calcified plaque proximal right ICA at the carotid bifurcation, velocities are within 1-39% range of stenosis bilaterally, vertebral arteries are patent and antegrade bilaterally. -LDL: 96; Lipitor increased -hemoglobin A1c pending -PT recommended no further follow-up, OT consult pending -Neurology consulted and appreciated and recommended TEE to be done on 09/25/2014 -Patient was using full dose aspirin at home however will transition to Plavix 75 mg daily  Hypertension -Currently on no home medications continue to monitor  Hyperlipidemia -Lipid panel: TC 169, triglycerides 77, HDL 58, LDL  96 -Lipitor increased  Bipolar disorder -Continue Seroquel  GERD -Continue PPI  Hypothyroidism -Continue Synthroid  Code Status: Full  Family Communication: husband at bedside.  Husband stated that patient needed to go home today she has a history of bipolar disorder and he is worried about her not sleeping well in the hospital.  Disposition Plan: Admitted, pending workup  Time Spent in minutes   30 minutes  Procedures  Carotid doppler Echocardiogram  Consults   Neurology  DVT Prophylaxis  Heparin  Lab Results  Component Value Date   PLT 236 09/24/2014    Medications  Scheduled Meds: . atorvastatin  10 mg Oral Daily  . clopidogrel  75 mg Oral Daily  . cycloSPORINE  1 drop Both Eyes BID  . heparin  5,000 Units Subcutaneous 3 times per day  . levothyroxine  50 mcg Oral QAC breakfast  . pantoprazole  80 mg Oral Daily  . [START ON 09/25/2014] pneumococcal 23 valent vaccine  0.5 mL Intramuscular Tomorrow-1000  . QUEtiapine  50 mg Oral QHS   Continuous Infusions:  PRN Meds:.albuterol  Antibiotics    Anti-infectives    None      Kito Cuffe D.O. on 09/24/2014 at 1:03 PM  Between 7am to 7pm - Pager -  [phone]  After 7pm go to [email or web address] - password [email or web address]  And look for the night coverage person covering for me after hours  [facility] Office  [phone]

## 2014-09-24 NOTE — Progress Notes (Signed)
UR completed 

## 2014-09-25 ENCOUNTER — Other Ambulatory Visit: Payer: Self-pay | Admitting: Nurse Practitioner

## 2014-09-25 ENCOUNTER — Encounter (HOSPITAL_COMMUNITY)
Admission: EM | Disposition: A | Discharge status: Home / Self Care | Payer: Self-pay | Source: Home / Self Care | Attending: Internal Medicine

## 2014-09-25 ENCOUNTER — Encounter (HOSPITAL_COMMUNITY): Payer: Self-pay

## 2014-09-25 ENCOUNTER — Encounter (HOSPITAL_COMMUNITY)
Admission: EM | Disposition: A | Discharge status: Home / Self Care | Payer: 59 | Source: Home / Self Care | Attending: Internal Medicine

## 2014-09-25 ENCOUNTER — Inpatient Hospital Stay (HOSPITAL_COMMUNITY): Payer: 59

## 2014-09-25 DIAGNOSIS — I6340 Cerebral infarction due to embolism of unspecified cerebral artery: Secondary | ICD-10-CM

## 2014-09-25 DIAGNOSIS — E039 Hypothyroidism, unspecified: Secondary | ICD-10-CM

## 2014-09-25 DIAGNOSIS — I639 Cerebral infarction, unspecified: Secondary | ICD-10-CM

## 2014-09-25 DIAGNOSIS — E785 Hyperlipidemia, unspecified: Secondary | ICD-10-CM

## 2014-09-25 DIAGNOSIS — F319 Bipolar disorder, unspecified: Secondary | ICD-10-CM

## 2014-09-25 DIAGNOSIS — I340 Nonrheumatic mitral (valve) insufficiency: Secondary | ICD-10-CM

## 2014-09-25 HISTORY — PX: TEE WITHOUT CARDIOVERSION: SHX5443

## 2014-09-25 HISTORY — PX: EP IMPLANTABLE DEVICE: SHX172B

## 2014-09-25 LAB — HEMOGLOBIN A1C
HEMOGLOBIN A1C: 5.8 % — AB (ref 4.8–5.6)
Mean Plasma Glucose: 120 mg/dL

## 2014-09-25 SURGERY — ECHOCARDIOGRAM, TRANSESOPHAGEAL
Anesthesia: Moderate Sedation

## 2014-09-25 SURGERY — LOOP RECORDER INSERTION
Anesthesia: LOCAL

## 2014-09-25 MED ORDER — FENTANYL CITRATE (PF) 100 MCG/2ML IJ SOLN
INTRAMUSCULAR | Status: AC
Start: 2014-09-25 — End: 2014-09-25
  Filled 2014-09-25: qty 2

## 2014-09-25 MED ORDER — LIDOCAINE-EPINEPHRINE 1 %-1:100000 IJ SOLN
INTRAMUSCULAR | Status: DC | PRN
  Administered 2014-09-25: 10 mL

## 2014-09-25 MED ORDER — MIDAZOLAM HCL 5 MG/ML IJ SOLN
INTRAMUSCULAR | Status: AC
  Filled 2014-09-25: qty 1

## 2014-09-25 MED ORDER — ATORVASTATIN CALCIUM 20 MG PO TABS: 20.0000 mg | ORAL_TABLET | Freq: Every day | ORAL | Status: DC

## 2014-09-25 MED ORDER — LIDOCAINE VISCOUS 2 % MT SOLN
OROMUCOSAL | Status: DC | PRN
  Administered 2014-09-25: 1 via OROMUCOSAL

## 2014-09-25 MED ORDER — SODIUM CHLORIDE 0.9 % IV SOLN
INTRAVENOUS | Status: DC
  Administered 2014-09-25: 09:00:00 via INTRAVENOUS

## 2014-09-25 MED ORDER — LIDOCAINE VISCOUS 2 % MT SOLN
OROMUCOSAL | Status: AC
  Filled 2014-09-25: qty 15

## 2014-09-25 MED ORDER — FENTANYL CITRATE (PF) 100 MCG/2ML IJ SOLN
INTRAMUSCULAR | Status: DC | PRN
Start: 2014-09-25 — End: 2014-09-25
  Administered 2014-09-25: 25 ug via INTRAVENOUS
  Administered 2014-09-25: 12.5 ug via INTRAVENOUS
  Administered 2014-09-25: 25 ug via INTRAVENOUS

## 2014-09-25 MED ORDER — MIDAZOLAM HCL 10 MG/2ML IJ SOLN
INTRAMUSCULAR | Status: DC | PRN
  Administered 2014-09-25 (×3): 2 mg via INTRAVENOUS

## 2014-09-25 MED ORDER — LIDOCAINE-EPINEPHRINE 1 %-1:100000 IJ SOLN
INTRAMUSCULAR | Status: AC
  Filled 2014-09-25: qty 1

## 2014-09-25 MED ORDER — CLOPIDOGREL BISULFATE 75 MG PO TABS: 75.0000 mg | ORAL_TABLET | Freq: Every day | ORAL | Status: DC

## 2014-09-25 SURGICAL SUPPLY — 2 items
LOOP REVEAL LINQSYS (Prosthesis & Implant Heart) ×1 IMPLANT
PACK LOOP INSERTION (CUSTOM PROCEDURE TRAY) ×1 IMPLANT

## 2014-09-25 NOTE — Op Note (Signed)
LA, LA appendage without masses No PFO by color doppler  WIht injection of agitated saline there was late appearance of very small bubbles in LA consistent with intrapulmonary shunt Trivial TR Trivial AI Mild MR Trivial PR. Normal LV function.   Full report pending

## 2014-09-25 NOTE — H&P (View-Only) (Signed)
[facility]                                                                              Patient Demographics  Shannon Collins, is a 64 y.o. female, DOB - 23-Jan-1951, GGY:[id number]  Admit date - 09/23/2014   Admitting Physician Etta Quill, DO  Outpatient Primary MD for the patient is Tawanna Solo, MD  LOS -    Chief Complaint  Patient presents with  . Numbness      HPI on 09/24/2014 by Dr. Jennette Kettle Shannon Collins is a 64 y.o. female with PMH of RA, HLD, presents to ED after developing sudden onset of weakness and numbness of the left arm at 11pm yesterday. Never had similar symptoms previously, weakness was such that she couldn't hold a glass of water or open a door with left hand. No slurred speech, confusion, facial droop, or leg weakness. Patient brought to ED at [facility] by husband. Weakness has improved since arrival in ED, now she has only a small amount of weakness remaining but isnt back to baseline yet.  Assessment & Plan  Patient admitted this morning by Dr. Jennette Kettle.  Agree with current assessment and plan.  Acute CVA -CT head: No acute intracranial process -MRI brain: Small patchy and linear acute ischemic infarcts involving cortical gray matter and posterior right frontal lobe -Echocardiogram: EF 03-50%, grade 1 diastolic dysfunction -Carotid doppler: Calcified plaque proximal right ICA at the carotid bifurcation, velocities are within 1-39% range of stenosis bilaterally, vertebral arteries are patent and antegrade bilaterally. -LDL: 96; Lipitor increased -hemoglobin A1c pending -PT recommended no further follow-up, OT consult pending -Neurology consulted and appreciated and recommended TEE to be done on 09/25/2014 -Patient was using full dose aspirin at home however will transition to Plavix 75 mg daily  Hypertension -Currently on no home medications continue to monitor  Hyperlipidemia -Lipid panel: TC 169, triglycerides 77, HDL 58, LDL  96 -Lipitor increased  Bipolar disorder -Continue Seroquel  GERD -Continue PPI  Hypothyroidism -Continue Synthroid  Code Status: Full  Family Communication: husband at bedside.  Husband stated that patient needed to go home today she has a history of bipolar disorder and he is worried about her not sleeping well in the hospital.  Disposition Plan: Admitted, pending workup  Time Spent in minutes   30 minutes  Procedures  Carotid doppler Echocardiogram  Consults   Neurology  DVT Prophylaxis  Heparin  Lab Results  Component Value Date   PLT 236 09/24/2014    Medications  Scheduled Meds: . atorvastatin  10 mg Oral Daily  . clopidogrel  75 mg Oral Daily  . cycloSPORINE  1 drop Both Eyes BID  . heparin  5,000 Units Subcutaneous 3 times per day  . levothyroxine  50 mcg Oral QAC breakfast  . pantoprazole  80 mg Oral Daily  . [START ON 09/25/2014] pneumococcal 23 valent vaccine  0.5 mL Intramuscular Tomorrow-1000  . QUEtiapine  50 mg Oral QHS   Continuous Infusions:  PRN Meds:.albuterol  Antibiotics    Anti-infectives    None      Costa Jha D.O. on 09/24/2014 at 1:03 PM  Between 7am to 7pm - Pager -  [phone]  After 7pm go to [email or web address] - password [email or web address]  And look for the night coverage person covering for me after hours  [facility] Office  [phone]

## 2014-09-25 NOTE — CV Procedure (Signed)
Electrophysiology procedure note  EP Procedure Note  Procedure performed: Insertion of an implantable loop recorder  Preprocedure diagnosis: cryptogenic stroke  Postprocedure diagnosis: Same as preprocedure diagnosis  Description of the procedure: After informed consent was obtained, the patient was prepped and draped in the usual manner. 20 cc of lidocaine was infiltrated into the left pectoral region. A 1 cm stab incision was carried out. The Medtronic implantable loop recorder, serial number [id number] was inserted. The R-wave measures 0.35 mV. Benzoin and Steri-Strips her pain on the skin. The patient was bandage and recovered in the usual fashion.  Complications: There were no immediate procedure complications  Conclusion: Successful insertion of a Medtronic implantable loop recorder for cryptogenic stroke .  Cristopher Peru, M.D.

## 2014-09-25 NOTE — Interval H&P Note (Signed)
History and Physical Interval Note:  09/25/2014 8:56 AM  [place]  has presented today for surgery, with the diagnosis of STROKE  The various methods of treatment have been discussed with the patient and family. After consideration of risks, benefits and other options for treatment, the patient has consented to  Procedure(s): TRANSESOPHAGEAL ECHOCARDIOGRAM (TEE) (N/A) as a surgical intervention .  The patient's history has been reviewed, patient examined, no change in status, stable for surgery.  I have reviewed the patient's chart and labs.  Questions were answered to the patient's satisfaction.     Shannon Collins

## 2014-09-25 NOTE — Discharge Instructions (Signed)
Stroke Prevention Some medical conditions and behaviors are associated with an increased chance of having a stroke. You may prevent a stroke by making healthy choices and managing medical conditions. HOW CAN I REDUCE MY RISK OF HAVING A STROKE?   Stay physically active. Get at least 30 minutes of activity on most or all days.  Do not smoke. It may also be helpful to avoid exposure to secondhand smoke.  Limit alcohol use. Moderate alcohol use is considered to be:  No more than 2 drinks per day for men.  No more than 1 drink per day for nonpregnant women.  Eat healthy foods. This involves:  Eating 5 or more servings of fruits and vegetables a day.  Making dietary changes that address high blood pressure (hypertension), high cholesterol, diabetes, or obesity.  Manage your cholesterol levels.  Making food choices that are high in fiber and low in saturated fat, trans fat, and cholesterol may control cholesterol levels.  Take any prescribed medicines to control cholesterol as directed by your health care provider.  Manage your diabetes.  Controlling your carbohydrate and sugar intake is recommended to manage diabetes.  Take any prescribed medicines to control diabetes as directed by your health care provider.  Control your hypertension.  Making food choices that are low in salt (sodium), saturated fat, trans fat, and cholesterol is recommended to manage hypertension.  Take any prescribed medicines to control hypertension as directed by your health care provider.  Maintain a healthy weight.  Reducing calorie intake and making food choices that are low in sodium, saturated fat, trans fat, and cholesterol are recommended to manage weight.  Stop drug abuse.  Avoid taking birth control pills.  Talk to your health care provider about the risks of taking birth control pills if you are over 7 years old, smoke, get migraines, or have ever had a blood clot.  Get evaluated for sleep  disorders (sleep apnea).  Talk to your health care provider about getting a sleep evaluation if you snore a lot or have excessive sleepiness.  Take medicines only as directed by your health care provider.  For some people, aspirin or blood thinners (anticoagulants) are helpful in reducing the risk of forming abnormal blood clots that can lead to stroke. If you have the irregular heart rhythm of atrial fibrillation, you should be on a blood thinner unless there is a good reason you cannot take them.  Understand all your medicine instructions.  Make sure that other conditions (such as anemia or atherosclerosis) are addressed. SEEK IMMEDIATE MEDICAL CARE IF:   You have sudden weakness or numbness of the face, arm, or leg, especially on one side of the body.  Your face or eyelid droops to one side.  You have sudden confusion.  You have trouble speaking (aphasia) or understanding.  You have sudden trouble seeing in one or both eyes.  You have sudden trouble walking.  You have dizziness.  You have a loss of balance or coordination.  You have a sudden, severe headache with no known cause.  You have new chest pain or an irregular heartbeat. Any of these symptoms may represent a serious problem that is an emergency. Do not wait to see if the symptoms will go away. Get medical help at once. Call your local emergency services (911 in [place] Do not drive yourself to the hospital. Document Released: 06/16/2004 Document Revised: 09/23/2013 Document Reviewed: 11/09/2012 [facility] Patient Information 2015 [place], [place]. This information is not intended to replace advice given  to you by your health care provider. Make sure you discuss any questions you have with your health care provider.       To whom it concern: Please excuse Mr.Sabina from work beginning 09/23/2014 through 09/29/2014 due to his wife's hospitalization.  He may return to work on 09/30/2014.  Please feel free to contact [facility] office [phone] for any questions.  Sincerely, Cristal Ford, DO [facility] 09/25/2014, 11:10 AM  Transient Ischemic Attack A transient ischemic attack (TIA) is a "warning stroke" that causes stroke-like symptoms. Unlike a stroke, a TIA does not cause permanent damage to the brain. The symptoms of a TIA can happen very fast and do not last long. It is important to know the symptoms of a TIA and what to do. This can help prevent a major stroke or death. CAUSES   A TIA is caused by a temporary blockage in an artery in the brain or neck (carotid artery). The blockage does not allow the brain to get the blood supply it needs and can cause different symptoms. The blockage can be caused by either:  A blood clot.  Fatty buildup (plaque) in a neck or brain artery. RISK FACTORS  High blood pressure (hypertension).  High cholesterol.  Diabetes mellitus.  Heart disease.  The build up of plaque in the blood vessels (peripheral artery disease or atherosclerosis).  The build up of plaque in the blood vessels providing blood and oxygen to the brain (carotid artery stenosis).  An abnormal heart rhythm (atrial fibrillation).  Obesity.  Smoking.  Taking oral contraceptives (especially in combination with smoking).  Physical inactivity.  A diet high in fats, salt (sodium), and calories.  Alcohol use.  Use of illegal drugs (especially cocaine and methamphetamine).  Being female.  Being African American.  Being over the age of 61.  Family history of stroke.  Previous history of blood clots, stroke, TIA, or heart attack.  Sickle cell disease. SYMPTOMS  TIA symptoms are the same as a stroke but are temporary. These symptoms usually develop suddenly, or may be newly present upon awakening from sleep:  Sudden weakness or numbness of the face, arm, or leg, especially on one side of the body.  Sudden trouble walking or difficulty moving arms or legs.  Sudden  confusion.  Sudden personality changes.  Trouble speaking (aphasia) or understanding.  Difficulty swallowing.  Sudden trouble seeing in one or both eyes.  Double vision.  Dizziness.  Loss of balance or coordination.  Sudden severe headache with no known cause.  Trouble reading or writing.  Loss of bowel or bladder control.  Loss of consciousness. DIAGNOSIS  Your caregiver may be able to determine the presence or absence of a TIA based on your symptoms, history, and physical exam. Computed tomography (CT scan) of the brain is usually performed to help identify a TIA. Other tests may be done to diagnose a TIA. These tests may include:  Electrocardiography.  Continuous heart monitoring.  Echocardiography.  Carotid ultrasonography.  Magnetic resonance imaging (MRI).  A scan of the brain circulation.  Blood tests. PREVENTION  The risk of a TIA can be decreased by appropriately treating high blood pressure, high cholesterol, diabetes, heart disease, and obesity and by quitting smoking, limiting alcohol, and staying physically active. TREATMENT  Time is of the essence. Since the symptoms of TIA are the same as a stroke, it is important to seek treatment as soon as possible because you may need a medicine to dissolve the clot (thrombolytic)  that cannot be given if too much time has passed. Treatment options vary. Treatment options may include rest, oxygen, intravenous (IV) fluids, and medicines to thin the blood (anticoagulants). Medicines and diet may be used to address diabetes, high blood pressure, and other risk factors. Measures will be taken to prevent short-term and long-term complications, including infection from breathing foreign material into the lungs (aspiration pneumonia), blood clots in the legs, and falls. Treatment options include procedures to either remove plaque in the carotid arteries or dilate carotid arteries that have narrowed due to plaque. Those procedures  are:  Carotid endarterectomy.  Carotid angioplasty and stenting. HOME CARE INSTRUCTIONS   Take all medicines prescribed by your caregiver. Follow the directions carefully. Medicines may be used to control risk factors for a stroke. Be sure you understand all your medicine instructions.  You may be told to take aspirin or the anticoagulant warfarin. Warfarin needs to be taken exactly as instructed.  Taking too much or too little warfarin is dangerous. Too much warfarin increases the risk of bleeding. Too little warfarin continues to allow the risk for blood clots. While taking warfarin, you will need to have regular blood tests to measure your blood clotting time. A PT blood test measures how long it takes for blood to clot. Your PT is used to calculate another value called an INR. Your PT and INR help your caregiver to adjust your dose of warfarin. The dose can change for many reasons. It is critically important that you take warfarin exactly as prescribed.  Many foods, especially foods high in vitamin K can interfere with warfarin and affect the PT and INR. Foods high in vitamin K include spinach, kale, broccoli, cabbage, collard and turnip greens, brussels sprouts, peas, cauliflower, seaweed, and parsley as well as beef and pork liver, green tea, and soybean oil. You should eat a consistent amount of foods high in vitamin K. Avoid major changes in your diet, or notify your caregiver before changing your diet. Arrange a visit with a dietitian to answer your questions.  Many medicines can interfere with warfarin and affect the PT and INR. You must tell your caregiver about any and all medicines you take, this includes all vitamins and supplements. Be especially cautious with aspirin and anti-inflammatory medicines. Do not take or discontinue any prescribed or over-the-counter medicine except on the advice of your caregiver or pharmacist.  Warfarin can have side effects, such as excessive bruising or  bleeding. You will need to hold pressure over cuts for longer than usual. Your caregiver or pharmacist will discuss other potential side effects.  Avoid sports or activities that may cause injury or bleeding.  Be mindful when shaving, flossing your teeth, or handling sharp objects.  Alcohol can change the body's ability to handle warfarin. It is best to avoid alcoholic drinks or consume only very small amounts while taking warfarin. Notify your caregiver if you change your alcohol intake.  Notify your dentist or other caregivers before procedures.  Eat a diet that includes 5 or more servings of fruits and vegetables each day. This may reduce the risk of stroke. Certain diets may be prescribed to address high blood pressure, high cholesterol, diabetes, or obesity.  A low-sodium, low-saturated fat, low-trans fat, low-cholesterol diet is recommended to manage high blood pressure.  A low-saturated fat, low-trans fat, low-cholesterol, and high-fiber diet may control cholesterol levels.  A controlled-carbohydrate, controlled-sugar diet is recommended to manage diabetes.  A reduced-calorie, low-sodium, low-saturated fat, low-trans fat, low-cholesterol diet is  recommended to manage obesity.  Maintain a healthy weight.  Stay physically active. It is recommended that you get at least 30 minutes of activity on most or all days.  Do not smoke.  Limit alcohol use even if you are not taking warfarin. Moderate alcohol use is considered to be:  No more than 2 drinks each day for men.  No more than 1 drink each day for nonpregnant women.  Stop drug abuse.  Home safety. A safe home environment is important to reduce the risk of falls. Your caregiver may arrange for specialists to evaluate your home. Having grab bars in the bedroom and bathroom is often important. Your caregiver may arrange for equipment to be used at home, such as raised toilets and a seat for the shower.  Follow all instructions  for follow-up with your caregiver. This is very important. This includes any referrals and lab tests. Proper follow up can prevent a stroke or another TIA from occurring. SEEK MEDICAL CARE IF:  You have personality changes.  You have difficulty swallowing.  You are seeing double.  You have dizziness.  You have a fever.  You have skin breakdown. SEEK IMMEDIATE MEDICAL CARE IF:  Any of these symptoms may represent a serious problem that is an emergency. Do not wait to see if the symptoms will go away. Get medical help right away. Call your local emergency services (911 in [place] Do not drive yourself to the hospital.  You have sudden weakness or numbness of the face, arm, or leg, especially on one side of the body.  You have sudden trouble walking or difficulty moving arms or legs.  You have sudden confusion.  You have trouble speaking (aphasia) or understanding.  You have sudden trouble seeing in one or both eyes.  You have a loss of balance or coordination.  You have a sudden, severe headache with no known cause.  You have new chest pain or an irregular heartbeat.  You have a partial or total loss of consciousness. MAKE SURE YOU:   Understand these instructions.  Will watch your condition.  Will get help right away if you are not doing well or get worse. Document Released: 02/16/2005 Document Revised: 05/14/2013 Document Reviewed: 08/14/2013 [facility] Patient Information 2015 [place], [place]. This information is not intended to replace advice given to you by your health care provider. Make sure you discuss any questions you have with your health care provider. Atorvastatin tablets What is this medicine? ATORVASTATIN (a TORE va sta tin) is known as a HMG-CoA reductase inhibitor or 'statin'. It lowers the level of cholesterol and triglycerides in the blood. This drug may also reduce the risk of heart attack, stroke, or other health problems in patients with risk factors for  heart disease. Diet and lifestyle changes are often used with this drug. This medicine may be used for other purposes; ask your health care provider or pharmacist if you have questions. COMMON BRAND NAME(S): Lipitor What should I tell my health care provider before I take this medicine? They need to know if you have any of these conditions: -frequently drink alcoholic beverages -history of stroke, TIA -kidney disease -liver disease -muscle aches or weakness -other medical condition -an unusual or allergic reaction to atorvastatin, other medicines, foods, dyes, or preservatives -pregnant or trying to get pregnant -breast-feeding How should I use this medicine? Take this medicine by mouth with a glass of water. Follow the directions on the prescription label. You can take this medicine with or  without food. Take your doses at regular intervals. Do not take your medicine more often than directed. Talk to your pediatrician regarding the use of this medicine in children. While this drug may be prescribed for children as young as 58 years old for selected conditions, precautions do apply. Overdosage: If you think you have taken too much of this medicine contact a poison control center or emergency room at once. NOTE: This medicine is only for you. Do not share this medicine with others. What if I miss a dose? If you miss a dose, take it as soon as you can. If it is almost time for your next dose, take only that dose. Do not take double or extra doses. What may interact with this medicine? Do not take this medicine with any of the following medications: -red yeast rice -telaprevir -telithromycin -voriconazole This medicine may also interact with the following medications: -alcohol -antiviral medicines for HIV or AIDS -boceprevir -certain antibiotics like clarithromycin, erythromycin, troleandomycin -certain medicines for cholesterol like fenofibrate or  gemfibrozil -cimetidine -clarithromycin -colchicine -cyclosporine -digoxin -female hormones, like estrogens or progestins and birth control pills -grapefruit juice -medicines for fungal infections like fluconazole, itraconazole, ketoconazole -niacin -rifampin -spironolactone This list may not describe all possible interactions. Give your health care provider a list of all the medicines, herbs, non-prescription drugs, or dietary supplements you use. Also tell them if you smoke, drink alcohol, or use illegal drugs. Some items may interact with your medicine. What should I watch for while using this medicine? Visit your doctor or health care professional for regular check-ups. You may need regular tests to make sure your liver is working properly. Tell your doctor or health care professional right away if you get any unexplained muscle pain, tenderness, or weakness, especially if you also have a fever and tiredness. Your doctor or health care professional may tell you to stop taking this medicine if you develop muscle problems. If your muscle problems do not go away after stopping this medicine, contact your health care professional. This drug is only part of a total heart-health program. Your doctor or a dietician can suggest a low-cholesterol and low-fat diet to help. Avoid alcohol and smoking, and keep a proper exercise schedule. Do not use this drug if you are pregnant or breast-feeding. Serious side effects to an unborn child or to an infant are possible. Talk to your doctor or pharmacist for more information. This medicine may affect blood sugar levels. If you have diabetes, check with your doctor or health care professional before you change your diet or the dose of your diabetic medicine. If you are going to have surgery tell your health care professional that you are taking this drug. What side effects may I notice from receiving this medicine? Side effects that you should report to your  doctor or health care professional as soon as possible: -allergic reactions like skin rash, itching or hives, swelling of the face, lips, or tongue -dark urine -fever -joint pain -muscle cramps, pain -redness, blistering, peeling or loosening of the skin, including inside the mouth -trouble passing urine or change in the amount of urine -unusually weak or tired -yellowing of eyes or skin Side effects that usually do not require medical attention (report to your doctor or health care professional if they continue or are bothersome): -constipation -heartburn -stomach gas, pain, upset This list may not describe all possible side effects. Call your doctor for medical advice about side effects. You may report side effects to FDA  at 1-800-FDA-1088. Where should I keep my medicine? Keep out of the reach of children. Store at room temperature between 20 to 25 degrees C (68 to 77 degrees F). Throw away any unused medicine after the expiration date. NOTE: This sheet is a summary. It may not cover all possible information. If you have questions about this medicine, talk to your doctor, pharmacist, or health care provider.  2015, [employer]. (2011-03-29 00:76:22)

## 2014-09-25 NOTE — Progress Notes (Signed)
STROKE TEAM PROGRESS NOTE   HISTORY Shannon Collins is an 64 y.o. female with a past medical history significant for hypercholesterolemia, thyroid disease, asthma, and RA, who was in her usual state of health until 10:30 or 11 pm yesterday when developed sudden onset of weakness and numbness of the left arm, " like my arm was dead". Stated that she never had similar symptoms before. Said that when she weakness became apparent she couldn't to hold a glass of water unless she use both hands, and was even not capable of opening a door with her left hand. She went to her bedroom and woke up her husband who noticed that she had obviouos weakness of the left arm but he did not notice weakness of the left leg or face, slurrred speech, confusion, or imbalance. Patient denied associated HA, vertigo, double vision, language or vision impairment. By the time she presented to [facility] her symptoms were already resolving (NIHSS 1) and hence she was not considered a proper candidate for thrombolysis. CT brain was reviewed and revealed no acute abnormality. She said that she is remarkably better but doesn't believe she is back to baseline yet. She was last known well 5/3/1 At 10:30 pm. NIHSS: 1 at the time of initial evaluation at [facility], but 0 at this time. MRS: 0. Patient was not administered TPA secondary to symptoms remarkably improved. She was admitted for further evaluation and treatment.   SUBJECTIVE (INTERVAL HISTORY) Her husband is at the bedside.  She is back from her TEE and loop. She is ready for discharge.   OBJECTIVE Temp:  [97.5 F (36.4 C)-98.6 F (37 C)] 97.8 F (36.6 C) (05/05 1024) Pulse Rate:  [58-98] 77 (05/05 1024) Cardiac Rhythm:  [-] Normal sinus rhythm (05/04 2010) Resp:  [12-27] 20 (05/05 1024) BP: (104-161)/(38-88) 111/50 mmHg (05/05 1024) SpO2:  [86 %-100 %] 95 % (05/05 1024)  No results for input(s): GLUCAP in the last 168 hours.  Recent Labs Lab 09/24/14 0027 09/24/14 0045  NA  140 140  K 3.4* 3.5  CL 108 106  CO2 28  --   GLUCOSE 101* 102*  BUN 15 16  CREATININE 0.79 0.80  CALCIUM 9.4  --     Recent Labs Lab 09/24/14 0027  AST 46*  ALT 53  ALKPHOS 94  BILITOT 0.5  PROT 7.6  ALBUMIN 4.2    Recent Labs Lab 09/24/14 0027 09/24/14 0045  WBC 6.2  --   NEUTROABS 3.0  --   HGB 12.5 13.6  HCT 39.2 40.0  MCV 88.3  --   PLT 236  --    No results for input(s): CKTOTAL, CKMB, CKMBINDEX, TROPONINI in the last 168 hours.  Recent Labs  09/24/14 0027  LABPROT 12.4  INR 0.91    Recent Labs  09/24/14 0044  COLORURINE YELLOW  LABSPEC 1.002*  PHURINE 6.5  GLUCOSEU NEGATIVE  HGBUR NEGATIVE  BILIRUBINUR NEGATIVE  KETONESUR NEGATIVE  PROTEINUR NEGATIVE  UROBILINOGEN 0.2  NITRITE NEGATIVE  LEUKOCYTESUR TRACE*       Component Value Date/Time   CHOL 169 09/24/2014 0732   TRIG 77 09/24/2014 0732   HDL 58 09/24/2014 0732   CHOLHDL 2.9 09/24/2014 0732   VLDL 15 09/24/2014 0732   LDLCALC 96 09/24/2014 0732   No results found for: HGBA1C    Component Value Date/Time   LABOPIA NONE DETECTED 09/24/2014 0044   COCAINSCRNUR NONE DETECTED 09/24/2014 0044   LABBENZ NONE DETECTED 09/24/2014 0044   AMPHETMU NONE DETECTED  09/24/2014 0044   THCU NONE DETECTED 09/24/2014 0044   LABBARB NONE DETECTED 09/24/2014 0044     Recent Labs Lab 09/24/14 0027  ETH <5    Ct Head Wo Contrast 09/24/2014   1. No acute intracranial process. 2. Mild chronic microvascular ischemic disease.     MRI HEAD  09/24/2014   1. Small patchy and linear acute ischemic infarcts involving the cortical gray matter and underlying deep white matter of the posterior right frontal lobe as above. No associated hemorrhage or significant mass effect. 2. Mild chronic small vessel ischemic disease.    MRA HEAD  09/24/2014    1. No proximal branch occlusion or hemodynamically significant stenosis identified within the intracranial circulation. 2. Diminutive basilar artery, with the  posterior cerebral arteries supplied in large part be a prominent posterior communicating arteries bilaterally.     2D Echocardiogram   - Left ventricle: The cavity size was normal. Systolic function wasnormal. The estimated ejection fraction was in the range of 55%to 60%. Wall motion was normal; there were no regional wallmotion abnormalities. There was an increased relativecontribution of atrial contraction to ventricular filling.Doppler parameters are consistent with abnormal left ventricularrelaxation (grade 1 diastolic dysfunction). - Aortic valve: Trileaflet; mildly thickened, mildly calcifiedleaflets. There was mild regurgitation. - Mitral valve: There was mild regurgitation. - Atrial septum: There was increased thickness of the septum,consistent with lipomatous hypertrophy. - Tricuspid valve: There was trivial regurgitation. - Pulmonic valve: There was mild regurgitation.  Carotid Doppler  Right proximal ICA with calicified plaque at the carotid bifurcation, however, the velocities are not markedly elevated throughout this segment. Peak systolic and end diastolic velocities and ratios are within the upper limits of 1-39% stenosis. Left ICA stenosis within the 1-39% range of stenosis. Vertebral arteries are patent and antegrade bilaterally.  TEE 09/25/2014 LA, LA appendage without masses. No PFO by color doppler WIht injection of agitated saline there was late appearance of very small bubbles in LA consistent with intrapulmonary shunt. Trivial TR. Trivial AI. Mild MR. Trivial PR. Normal LV function.   Loop Recorder 09/25/2014  Successful insertion of a [employer] implantable loop recorder for cryptogenic stroke   PHYSICAL EXAM Pleasant middle aged caucasian lady not in distress. . Afebrile. Head is nontraumatic. Neck is supple without bruit.    Cardiac exam no murmur or gallop. Lungs are clear to auscultation. Distal pulses are well felt. Neurological Exam ;  Awake  Alert oriented x 3.  Normal speech and language.eye movements full without nystagmus.fundi were not visualized. Vision acuity and fields appear normal. Hearing is normal. Palatal movements are normal. Face symmetric. Tongue midline. Normal strength, tone, reflexes and coordination. Normal sensation. Gait deferred.   ASSESSMENT/PLAN Ms. DHRUTI GHUMAN is a 64 y.o. female with history of hypercholesterolemia, thyroid disease, asthma, and RA presenting with weakness and numbness of the left arm. She did not receive IV t-PA due to improvement in symptoms.   Stroke:  Patchy Non-dominant right frontal lobe infarct secondary to unknown embolic source  Resultant  Left arm numbness  MRI  Patchy R frontal lobe infarcts  MRA  No significant stenosis   Carotid Doppler  No significant stenosis   2D Echo  No source of embolus  TEE no PFO. No embolic source [place] electrophysiologist consulted and placed an implantable loop recorder to evaluate for atrial fibrillation as etiology of stroke.   aspirin 325 mg orally every day prior to admission, changed to clopidogrel 75 mg orally every day.  Patient counseled to be compliant with her antithrombotic medications  Ongoing aggressive stroke risk factor management  Therapy recommendations:  No PT   Disposition:  return home  Katy for discharge from stroke standpoint  Follow up Dr. Leonie Man in 2 months  Hypertension  Stable  Hyperlipidemia  Home meds:  lipitor 10 and fish oil, lipitor resumed in hospital  LDL 96, goal < 70  increased lipitor to 20 mg daily.   Dr. Leonie Man discussed with pt/husband  Continue statin at discharge  Other Stroke Risk Factors  Family hx TIA (father). Thinks her dad also has atrial fibrillation, he is on coumadin  Other Active Problems  Asthma  RA  thyroid  [facility] See Amion for Pager information 09/25/2014 3:06 PM  I have personally examined this patient, reviewed  notes, independently viewed imaging studies, participated in medical decision making and plan of care. I have made any additions or clarifications directly to the above note. Agree with note above. Continue Plavix and increased dose of atorvastatin to 20 mg daily. Follow-up as an outpatient in 2 months or call earlier if necessary.  Antony Contras, MD Medical Director [facility] Pager: [phone] 09/25/2014 3:58 PM    To contact Stroke Continuity provider, please refer to [email or web address] After hours, contact General Neurology

## 2014-09-25 NOTE — Discharge Summary (Signed)
Physician Discharge Summary  Shannon Collins KCL:[id number] DOB: 1950-08-14 DOA: 09/23/2014  PCP: Tawanna Solo, MD  Admit date: 09/23/2014 Discharge date: 09/25/2014  Time spent: 45 minutes  Recommendations for Outpatient Follow-up:  Patient will be discharged to home. Patient will need to follow up with primary care provider within one week of discharge, and Dr. Leonie Man, neurology within 2 months of discharge. Patient should continue medications as prescribed. Patient should follow a heart healthy diet. Resume activity as tolerated.  Discharge Diagnoses:  Acute CVA Hypertension Hyperlipidemia Bipolar disorder GERD Hypothyroidism  Discharge Condition: Stable  Diet recommendation: Heart healthy  Filed Weights   09/24/14 0500  Weight: 61.372 kg (135 lb 4.8 oz)    History of present illness:  on 09/24/2014 by Dr. Jennette Kettle Shannon Collins is a 64 y.o. female with PMH of RA, HLD, presents to ED after developing sudden onset of weakness and numbness of the left arm at 11pm yesterday. Never had similar symptoms previously, weakness was such that she couldn't hold a glass of water or open a door with left hand. No slurred speech, confusion, facial droop, or leg weakness. Patient brought to ED at [facility] by husband. Weakness has improved since arrival in ED, now she has only a small amount of weakness remaining but isnt back to baseline yet.  Hospital Course:  Acute CVA -CT head: No acute intracranial process -MRI brain: Small patchy and linear acute ischemic infarcts involving cortical gray matter and posterior right frontal lobe -Echocardiogram: EF 49-44%, grade 1 diastolic dysfunction -Carotid doppler: Calcified plaque proximal right ICA at the carotid bifurcation, velocities are within 1-39% range of stenosis bilaterally, vertebral arteries are patent and antegrade bilaterally. -LDL: 96; Lipitor increased -hemoglobin A1c pending and can be followed by PCP -PT recommended no  further follow-up -Neurology consulted and appreciated and recommended TEE -Patient was using full dose aspirin at home however will transition to Plavix 75 mg daily -TEE: No PFO, trival TR/AI/PR, mild MR, normal function -Loop recorder placed -Patient will need to follow up with Dr. Leonie Man in 1-2 months  Hypertension -Currently on no home medications continue to monitor  Hyperlipidemia -Lipid panel: TC 169, triglycerides 77, HDL 58, LDL 96 -Increased dose of lipitor to 20mg  daily.   Bipolar disorder -Continue Seroquel  GERD -Continue PPI  Hypothyroidism -Continue Synthroid  Procedures  Carotid doppler Echocardiogram TEE Loop Recorder Implantation   Consults  Neurology Cardiology  Discharge Exam: Filed Vitals:   09/25/14 1024  BP: 111/50  Pulse: 77  Temp: 97.8 F (36.6 C)  Resp: 20     General: Well developed, well nourished, No distress  HEENT: NCAT,  mucous membranes moist.  Cardiovascular: S1 S2 auscultated, no rubs, murmurs or gallops. Regular rate and rhythm.  Respiratory: Clear to auscultation bilaterally with equal chest rise  Abdomen: Soft, nontender, nondistended, + bowel sounds  Extremities: warm dry without cyanosis clubbing or edema  Neuro: AAOx3,nonfocal, gait normal  Psych: Normal affect and demeanor with intact judgement and insight  Discharge Instructions      Discharge Instructions    Discharge instructions    Complete by:  As directed   Patient will be discharged to home. Patient will need to follow up with primary care provider within one week of discharge, and Dr. Leonie Man, neurology within 2 months of discharge.  Patient should continue medications as prescribed.  Patient should follow a heart healthy diet.  Resume activity as tolerated.            Medication  List    STOP taking these medications        aspirin 325 MG tablet      TAKE these medications        albuterol 108 (90 BASE) MCG/ACT inhaler  Commonly known  as:  PROVENTIL HFA;VENTOLIN HFA  Inhale 2 puffs into the lungs every 6 (six) hours as needed for wheezing or shortness of breath.     atorvastatin 20 MG tablet  Commonly known as:  LIPITOR  Take 1 tablet (20 mg total) by mouth daily.     beta carotene w/minerals tablet  Take 1 tablet by mouth daily.     calcium carbonate 600 MG Tabs tablet  Commonly known as:  OS-CAL  Take 1,200 mg by mouth daily.     clopidogrel 75 MG tablet  Commonly known as:  PLAVIX  Take 1 tablet (75 mg total) by mouth daily.     cycloSPORINE 0.05 % ophthalmic emulsion  Commonly known as:  RESTASIS  Place 1 drop into both eyes 2 (two) times daily.     Fish Oil 1000 MG Caps  Take 4,000 capsules by mouth daily.     GRAPE SEED CR PO  Take 1 tablet by mouth daily.     HUMIRA 40 MG/0.8ML Pskt  Generic drug:  Adalimumab  Inject 40 mg into the skin every 7 (seven) days. Inject into skin once every other week     levothyroxine 50 MCG tablet  Commonly known as:  SYNTHROID, LEVOTHROID  Take 50 mcg by mouth daily before breakfast.     mometasone 50 MCG/ACT nasal spray  Commonly known as:  NASONEX  Place into the nose daily.     multivitamin tablet  Take 1 tablet by mouth daily.     omeprazole 40 MG capsule  Commonly known as:  PRILOSEC  Take 1 capsule (40 mg total) by mouth 2 (two) times daily.     potassium gluconate 595 MG Tabs tablet  Take 595 mg by mouth daily.     SEROQUEL XR 50 MG Tb24 24 hr tablet  Generic drug:  QUEtiapine  Take 75 mg by mouth at bedtime.     vitamin C 500 MG tablet  Commonly known as:  ASCORBIC ACID  Take 500 mg by mouth daily.     Vitamin D 2000 UNITS Caps  Take 2,000 Units by mouth daily.     vitamin E 400 UNIT capsule  Take 400 Units by mouth daily.       Allergies  Allergen Reactions  . Azithromycin     rash  . Biaxin [Clarithromycin]     "does not remember reaction"  . Codeine     Feels like she will "pass out"  . Tramadol Other (See Comments)     Feels as though I will pass out   Follow-up Information    Follow up with Tawanna Solo, MD. Schedule an appointment as soon as possible for a visit in 1 week.   Specialty:  Family Medicine   Why:  Hospital follow up   Contact information:   [place] [address] [phone]       Follow up with SETHI,PRAMOD, MD In 2 months.   Specialties:  Neurology, Radiology   Why:  Stroke clinic   Contact information:   [address] [place] [address] [phone]        The results of significant diagnostics from this hospitalization (including imaging, microbiology, ancillary and laboratory) are  listed below for reference.    Significant Diagnostic Studies: Ct Head Wo Contrast  09/24/2014   CLINICAL DATA:  Initial evaluation for acute left arm weakness.  EXAM: CT HEAD WITHOUT CONTRAST  TECHNIQUE: Contiguous axial images were obtained from the base of the skull through the vertex without intravenous contrast.  COMPARISON:  None.  FINDINGS: There is no acute intracranial hemorrhage or infarct. No mass lesion or midline shift. Gray-white matter differentiation is well maintained. Ventricles are normal in size without evidence of hydrocephalus. CSF containing spaces are within normal limits. No extra-axial fluid collection.  Mild chronic small vessel ischemic changes present.  The calvarium is intact.  Orbital soft tissues are within normal limits.  The paranasal sinuses and mastoid air cells are well pneumatized and free of fluid.  Scalp soft tissues are unremarkable.  IMPRESSION: 1. No acute intracranial process. 2. Mild chronic microvascular ischemic disease.   Electronically Signed   By: Jeannine Boga M.D.   On: 09/24/2014 01:02   Mr Jodene Nam Head Wo Contrast  09/24/2014   CLINICAL DATA:  Initial evaluation for are sudden onset left arm weakness.  EXAM: MRI HEAD WITHOUT CONTRAST  MRA HEAD WITHOUT CONTRAST  TECHNIQUE: Multiplanar, multiecho pulse sequences of  the brain and surrounding structures were obtained without intravenous contrast. Angiographic images of the head were obtained using MRA technique without contrast.  COMPARISON:  Prior CT from earlier the same day.  FINDINGS: MRI HEAD FINDINGS  Cerebral volume within normal limits for patient age. Mild scattered T2/FLAIR hyperintensity within the periventricular and deep white matter most consistent with chronic small vessel ischemic changes, fairly mild for patient age.  There is a small patchy and linear foci of abnormal restricted diffusion involving the cortical gray matter and underlying deep white matter of the posterior right frontal region, compatible with an acute ischemic infarct (series 5, image 16). This extends towards the precentral gyrus. No significant mass effect. No associated hemorrhage. Probable additional tiny infarct within the right caudate (series 3, image 29). No other acute intracranial infarct.  No mass lesion or midline shift. Ventricles are normal size without evidence of hydrocephalus. No extra-axial fluid collection.  Craniocervical junction within normal limits. Pituitary gland unremarkable.  No acute abnormality about the orbits. Paranasal sinuses are clear. No mastoid effusion. Inner ear structures normal.  Bone marrow signal intensity within normal limits. No scalp soft tissue abnormality.  MRA HEAD FINDINGS  ANTERIOR CIRCULATION:  Visualized distal cervical segments of the internal carotid arteries are widely patent with antegrade flow. The petrous, cavernous, and supra clinoid segments are widely patent. A1 segments, anterior communicating artery, and anterior cerebral arteries well opacified. M1 segments widely patent without stenosis or occlusion. MCA bifurcations normal. Distal MCA branches well opacified bilaterally.  POSTERIOR CIRCULATION:  Vertebral arteries are widely patent to the vertebrobasilar junction. Posterior inferior cerebral arteries patent bilaterally. Basilar  artery is somewhat diminutive but patent. Superior cerebellar arteries patent bilaterally. Posterior cerebral arteries widely patent bilaterally. There are prominent posterior communicating arteries bilaterally that felt to supply the PCAs bilaterally. These are not true fetal origins, as small P1 segments present bilaterally.  No aneurysm.  IMPRESSION: MRI HEAD IMPRESSION:  1. Small patchy and linear acute ischemic infarcts involving the cortical gray matter and underlying deep white matter of the posterior right frontal lobe as above. No associated hemorrhage or significant mass effect. 2. Mild chronic small vessel ischemic disease.  MRA HEAD IMPRESSION:  1. No proximal branch occlusion or hemodynamically significant stenosis identified within  the intracranial circulation. 2. Diminutive basilar artery, with the posterior cerebral arteries supplied in large part be a prominent posterior communicating arteries bilaterally.   Electronically Signed   By: Jeannine Boga M.D.   On: 09/24/2014 04:44   Mr Brain Wo Contrast  09/24/2014   CLINICAL DATA:  Initial evaluation for are sudden onset left arm weakness.  EXAM: MRI HEAD WITHOUT CONTRAST  MRA HEAD WITHOUT CONTRAST  TECHNIQUE: Multiplanar, multiecho pulse sequences of the brain and surrounding structures were obtained without intravenous contrast. Angiographic images of the head were obtained using MRA technique without contrast.  COMPARISON:  Prior CT from earlier the same day.  FINDINGS: MRI HEAD FINDINGS  Cerebral volume within normal limits for patient age. Mild scattered T2/FLAIR hyperintensity within the periventricular and deep white matter most consistent with chronic small vessel ischemic changes, fairly mild for patient age.  There is a small patchy and linear foci of abnormal restricted diffusion involving the cortical gray matter and underlying deep white matter of the posterior right frontal region, compatible with an acute ischemic infarct  (series 5, image 16). This extends towards the precentral gyrus. No significant mass effect. No associated hemorrhage. Probable additional tiny infarct within the right caudate (series 3, image 29). No other acute intracranial infarct.  No mass lesion or midline shift. Ventricles are normal size without evidence of hydrocephalus. No extra-axial fluid collection.  Craniocervical junction within normal limits. Pituitary gland unremarkable.  No acute abnormality about the orbits. Paranasal sinuses are clear. No mastoid effusion. Inner ear structures normal.  Bone marrow signal intensity within normal limits. No scalp soft tissue abnormality.  MRA HEAD FINDINGS  ANTERIOR CIRCULATION:  Visualized distal cervical segments of the internal carotid arteries are widely patent with antegrade flow. The petrous, cavernous, and supra clinoid segments are widely patent. A1 segments, anterior communicating artery, and anterior cerebral arteries well opacified. M1 segments widely patent without stenosis or occlusion. MCA bifurcations normal. Distal MCA branches well opacified bilaterally.  POSTERIOR CIRCULATION:  Vertebral arteries are widely patent to the vertebrobasilar junction. Posterior inferior cerebral arteries patent bilaterally. Basilar artery is somewhat diminutive but patent. Superior cerebellar arteries patent bilaterally. Posterior cerebral arteries widely patent bilaterally. There are prominent posterior communicating arteries bilaterally that felt to supply the PCAs bilaterally. These are not true fetal origins, as small P1 segments present bilaterally.  No aneurysm.  IMPRESSION: MRI HEAD IMPRESSION:  1. Small patchy and linear acute ischemic infarcts involving the cortical gray matter and underlying deep white matter of the posterior right frontal lobe as above. No associated hemorrhage or significant mass effect. 2. Mild chronic small vessel ischemic disease.  MRA HEAD IMPRESSION:  1. No proximal branch occlusion or  hemodynamically significant stenosis identified within the intracranial circulation. 2. Diminutive basilar artery, with the posterior cerebral arteries supplied in large part be a prominent posterior communicating arteries bilaterally.   Electronically Signed   By: Jeannine Boga M.D.   On: 09/24/2014 04:44    Microbiology: No results found for this or any previous visit (from the past 240 hour(s)).   Labs: Basic Metabolic Panel:  Recent Labs Lab 09/24/14 0027 09/24/14 0045  NA 140 140  K 3.4* 3.5  CL 108 106  CO2 28  --   GLUCOSE 101* 102*  BUN 15 16  CREATININE 0.79 0.80  CALCIUM 9.4  --    Liver Function Tests:  Recent Labs Lab 09/24/14 0027  AST 46*  ALT 53  ALKPHOS 94  BILITOT 0.5  PROT  7.6  ALBUMIN 4.2   No results for input(s): LIPASE, AMYLASE in the last 168 hours. No results for input(s): AMMONIA in the last 168 hours. CBC:  Recent Labs Lab 09/24/14 0027 09/24/14 0045  WBC 6.2  --   NEUTROABS 3.0  --   HGB 12.5 13.6  HCT 39.2 40.0  MCV 88.3  --   PLT 236  --    Cardiac Enzymes: No results for input(s): CKTOTAL, CKMB, CKMBINDEX, TROPONINI in the last 168 hours. BNP: BNP (last 3 results) No results for input(s): BNP in the last 8760 hours.  ProBNP (last 3 results) No results for input(s): PROBNP in the last 8760 hours.  CBG: No results for input(s): GLUCAP in the last 168 hours.     SignedCristal Ford  [facility] 09/25/2014, 11:13 AM

## 2014-09-25 NOTE — Progress Notes (Signed)
Echocardiogram Echocardiogram Transesophageal has been performed.  Shannon Collins 09/25/2014, 9:52 AM

## 2014-09-26 ENCOUNTER — Encounter (HOSPITAL_COMMUNITY): Payer: Self-pay | Admitting: Internal Medicine

## 2014-10-08 ENCOUNTER — Ambulatory Visit: Payer: 59

## 2014-10-15 ENCOUNTER — Ambulatory Visit: Payer: 59

## 2014-10-17 ENCOUNTER — Ambulatory Visit (INDEPENDENT_AMBULATORY_CARE_PROVIDER_SITE_OTHER): Payer: 59 | Admitting: *Deleted

## 2014-10-17 DIAGNOSIS — I639 Cerebral infarction, unspecified: Secondary | ICD-10-CM

## 2014-10-17 LAB — CUP PACEART INCLINIC DEVICE CHECK
MDC IDC SESS DTM: 20160527140126
MDC IDC SET ZONE DETECTION INTERVAL: 2000 ms
MDC IDC SET ZONE DETECTION INTERVAL: 3000 ms
MDC IDC SET ZONE DETECTION INTERVAL: 360 ms

## 2014-10-17 NOTE — Progress Notes (Signed)
Wound check s/p ILR implant. Wound well healed without redness or edema.  Pt with 0 tachy episodes; 0 brady episodes; 0 asystole; 0 symptom episodes; 0 AF episodes.   Plan to continue [facility] and f/u w/GT prn.

## 2014-10-24 ENCOUNTER — Ambulatory Visit (INDEPENDENT_AMBULATORY_CARE_PROVIDER_SITE_OTHER): Payer: 59 | Admitting: *Deleted

## 2014-10-24 DIAGNOSIS — I639 Cerebral infarction, unspecified: Secondary | ICD-10-CM | POA: Diagnosis not present

## 2014-10-27 ENCOUNTER — Ambulatory Visit: Payer: 59

## 2014-10-29 NOTE — Progress Notes (Signed)
Loop recorder 

## 2014-11-06 LAB — CUP PACEART REMOTE DEVICE CHECK: Date Time Interrogation Session: 20160616114352

## 2014-11-11 ENCOUNTER — Encounter: Payer: Self-pay | Admitting: Internal Medicine

## 2014-11-14 ENCOUNTER — Encounter: Payer: Self-pay | Admitting: Internal Medicine

## 2014-11-25 ENCOUNTER — Ambulatory Visit (INDEPENDENT_AMBULATORY_CARE_PROVIDER_SITE_OTHER): Payer: 59 | Admitting: *Deleted

## 2014-11-25 DIAGNOSIS — I639 Cerebral infarction, unspecified: Secondary | ICD-10-CM

## 2014-11-26 LAB — CUP PACEART REMOTE DEVICE CHECK: MDC IDC SESS DTM: 20160706130137

## 2014-11-26 NOTE — Progress Notes (Signed)
[facility] summary report. Battery ok. No episodes. Follow up with monthly summary reports.  Chanetta Marshall, NP 11/26/2014 1:02 PM

## 2014-12-22 ENCOUNTER — Telehealth: Payer: Self-pay

## 2014-12-22 ENCOUNTER — Ambulatory Visit (INDEPENDENT_AMBULATORY_CARE_PROVIDER_SITE_OTHER): Payer: 59 | Admitting: Neurology

## 2014-12-22 ENCOUNTER — Encounter: Payer: Self-pay | Admitting: Neurology

## 2014-12-22 VITALS — BP 115/75 | HR 93 | Ht 63.0 in | Wt 132.4 lb

## 2014-12-22 DIAGNOSIS — E784 Other hyperlipidemia: Secondary | ICD-10-CM | POA: Diagnosis not present

## 2014-12-22 DIAGNOSIS — E7849 Other hyperlipidemia: Secondary | ICD-10-CM

## 2014-12-22 NOTE — Telephone Encounter (Signed)
LVM for patient to inform her that her lab order for a lipid Profile was faxed to her PCP... She should call office if she has any other questions or concerns.

## 2014-12-22 NOTE — Patient Instructions (Addendum)
I had a long d/w patient and husband about her recent stroke, risk for recurrent stroke/TIAs, personally independently reviewed imaging studies and stroke evaluation results and answered questions.Continue Plavix  for secondary stroke prevention and maintain strict control of hypertension with blood pressure goal below 130/90, diabetes with hemoglobin A1c goal below 6.5% and lipids with LDL cholesterol goal below 100 mg/dL. I also advised the patient to eat a healthy diet with plenty of whole grains, cereals, fruits and vegetables, exercise regularly and maintain ideal body weight. Consider possible participation in the [place] trial if interested. Patient was given written information to review at home and call us if he wants to participate. Followup in the future with me in  3 months or call earlier if necessary. Stroke Prevention Some medical conditions and behaviors are associated with an increased chance of having a stroke. You may prevent a stroke by making healthy choices and managing medical conditions. HOW CAN I REDUCE MY RISK OF HAVING A STROKE?   Stay physically active. Get at least 30 minutes of activity on most or all days.  Do not smoke. It may also be helpful to avoid exposure to secondhand smoke.  Limit alcohol use. Moderate alcohol use is considered to be:  No more than 2 drinks per day for men.  No more than 1 drink per day for nonpregnant women.  Eat healthy foods. This involves:  Eating 5 or more servings of fruits and vegetables a day.  Making dietary changes that address high blood pressure (hypertension), high cholesterol, diabetes, or obesity.  Manage your cholesterol levels.  Making food choices that are high in fiber and low in saturated fat, trans fat, and cholesterol may control cholesterol levels.  Take any prescribed medicines to control cholesterol as directed by your health care provider.  Manage your diabetes.  Controlling your carbohydrate and sugar  intake is recommended to manage diabetes.  Take any prescribed medicines to control diabetes as directed by your health care provider.  Control your hypertension.  Making food choices that are low in salt (sodium), saturated fat, trans fat, and cholesterol is recommended to manage hypertension.  Take any prescribed medicines to control hypertension as directed by your health care provider.  Maintain a healthy weight.  Reducing calorie intake and making food choices that are low in sodium, saturated fat, trans fat, and cholesterol are recommended to manage weight.  Stop drug abuse.  Avoid taking birth control pills.  Talk to your health care provider about the risks of taking birth control pills if you are over 12 years old, smoke, get migraines, or have ever had a blood clot.  Get evaluated for sleep disorders (sleep apnea).  Talk to your health care provider about getting a sleep evaluation if you snore a lot or have excessive sleepiness.  Take medicines only as directed by your health care provider.  For some people, aspirin or blood thinners (anticoagulants) are helpful in reducing the risk of forming abnormal blood clots that can lead to stroke. If you have the irregular heart rhythm of atrial fibrillation, you should be on a blood thinner unless there is a good reason you cannot take them.  Understand all your medicine instructions.  Make sure that other conditions (such as anemia or atherosclerosis) are addressed. SEEK IMMEDIATE MEDICAL CARE IF:   You have sudden weakness or numbness of the face, arm, or leg, especially on one side of the body.  Your face or eyelid droops to one side.  You  have sudden confusion.  You have trouble speaking (aphasia) or understanding.  You have sudden trouble seeing in one or both eyes.  You have sudden trouble walking.  You have dizziness.  You have a loss of balance or coordination.  You have a sudden, severe headache with no  known cause.  You have new chest pain or an irregular heartbeat. Any of these symptoms may represent a serious problem that is an emergency. Do not wait to see if the symptoms will go away. Get medical help at once. Call your local emergency services (911 in [place] Do not drive yourself to the hospital. Document Released: 06/16/2004 Document Revised: 09/23/2013 Document Reviewed: 11/09/2012 [facility] Patient Information 2015 [place], [place]. This information is not intended to replace advice given to you by your health care provider. Make sure you discuss any questions you have with your health care provider.

## 2014-12-22 NOTE — Progress Notes (Signed)
[facility] [address] [place]. [place] [address] [phone]       OFFICE FOLLOW-UP NOTE  Ms. Shannon Collins Date of Birth:  07/23/1950 Medical Record Number:  [id number]   HPI: Shannon Collins is an 64 y.o. female seen today for first office follow-up visit following hospital admission for stroke on 09/24/14. She had past medical history significant for hypercholesterolemia, thyroid disease, asthma, and RA, who was in her usual state of health until 10:30 or 11 pm on 09/23/14 when developed sudden onset of weakness and numbness of the left arm, " like my arm was dead". Stated that she never had similar symptoms before. Said that when she weakness became apparent she couldn't to hold a glass of water unless she use both hands, and was even not capable of opening a door with her left hand. She went to her bedroom and woke up her husband who noticed that she had obviouos weakness of the left arm but he did not notice weakness of the left leg or face, slurrred speech, confusion, or imbalance. Patient denied associated HA, vertigo, double vision, language or vision impairment. By the time she presented to [facility] her symptoms were already resolving (NIHSS 1) and hence she was not considered a proper candidate for thrombolysis. CT brain was reviewed and revealed no acute abnormality.MRI HEAD  09/24/2014   1. Small patchy and linear acute ischemic infarcts involving the cortical gray matter and underlying deep white matter of the posterior right frontal lobe as above. No associated hemorrhage or significant mass effect. 2. Mild chronic small vessel ischemic disease.   MRA HEAD  09/24/2014    1. No proximal branch occlusion or hemodynamically significant stenosis identified within the intracranial circulation. 2. Diminutive basilar artery, with the posterior cerebral arteries supplied in large part be a prominent posterior communicating arteries bilaterally.    2D Echocardiogram   - Left  ventricle: The cavity size was normal. Systolic function wasnormal. The estimated ejection fraction was in the range of 55%to 60%. Wall motion was normal; there were no regional wallmotion abnormalities. LDL cholesterol was 96 mg percent and hemoglobin A1c was 5.8. Transesophageal echocardiogram did not show cardiac source of embolism or PFO. Patient underwent loop recorder insertion and so far paroxysmal atrial fibrillation has not yet been found. She states she's done well since discharge is made for neurological recovery and she has no residual neurological symptoms or deficits. She is tolerating Plavix well with only minor bruising but no bleeding episodes. She had trouble tolerating Lipitor 20 mg due to diffuse aches and pains and is doing much better and the dose was reduced to 10 mg a month ago. She states her blood pressure is doing well and it is 115/75 today. She has no new complaints.     ROS:   14 system review of systems is positive for  cough, constipation, easy bruising and all other systems negative  PMH:  Past Medical History  Diagnosis Date  . Asthma   . Rheumatoid arthritis   . High cholesterol   . Thyroid condition     Social History:  History   Social History  . Marital Status: Married    Spouse Name: N/A  . Number of Children: 3  . Years of Education: N/A   Occupational History  . retired    Social History Main Topics  . Smoking status: Never Smoker   . Smokeless tobacco: Not on file  . Alcohol Use: No  . Drug  Use: No  . Sexual Activity: No   Other Topics Concern  . Not on file   Social History Narrative    Medications:   Current Outpatient Prescriptions on File Prior to Visit  Medication Sig Dispense Refill  . albuterol (PROVENTIL HFA;VENTOLIN HFA) 108 (90 BASE) MCG/ACT inhaler Inhale 2 puffs into the lungs every 6 (six) hours as needed for wheezing or shortness of breath.    Marland Kitchen atorvastatin (LIPITOR) 20 MG tablet Take 1 tablet (20 mg total) by  mouth daily. (Patient taking differently: Take 10 mg by mouth daily. ) 30 tablet 0  . beta carotene w/minerals (OCUVITE) tablet Take 1 tablet by mouth daily.    . calcium carbonate (OS-CAL) 600 MG TABS tablet Take 1,200 mg by mouth 2 (two) times daily.     . Cholecalciferol (VITAMIN D) 2000 UNITS CAPS Take 2,000 Units by mouth daily.     . clopidogrel (PLAVIX) 75 MG tablet Take 1 tablet (75 mg total) by mouth daily. 30 tablet 0  . cycloSPORINE (RESTASIS) 0.05 % ophthalmic emulsion Place 1 drop into both eyes 2 (two) times daily.     Marland Kitchen GRAPE SEED CR PO Take 1 tablet by mouth daily.     Marland Kitchen HUMIRA 40 MG/0.8ML PSKT Inject 40 mg into the skin every 7 (seven) days. Inject into skin once every other week    . levothyroxine (SYNTHROID, LEVOTHROID) 50 MCG tablet Take 50 mcg by mouth daily before breakfast.    . mometasone (NASONEX) 50 MCG/ACT nasal spray Place into the nose daily.    . Multiple Vitamin (MULTIVITAMIN) tablet Take 1 tablet by mouth daily.    . Omega-3 Fatty Acids (FISH OIL) 1000 MG CAPS Take 2,000 capsules by mouth 2 (two) times daily.     . potassium gluconate 595 MG TABS tablet Take 595 mg by mouth daily.    . vitamin C (ASCORBIC ACID) 500 MG tablet Take 500 mg by mouth daily.    . vitamin E 400 UNIT capsule Take 400 Units by mouth daily.    Marland Kitchen omeprazole (PRILOSEC) 40 MG capsule Take 1 capsule (40 mg total) by mouth 2 (two) times daily. (Patient not taking: Reported on 12/22/2014) 60 capsule 0   No current facility-administered medications on file prior to visit.    Allergies:   Allergies  Allergen Reactions  . Azithromycin     rash  . Biaxin [Clarithromycin]     "does not remember reaction"  . Codeine     Feels like she will "pass out"  . Tramadol Other (See Comments)    Feels as though I will pass out    Physical Exam General: well developed, well nourished middle-age Caucasian lady, seated, in no evident distress Head: head normocephalic and atraumatic.  Neck: supple with  no carotid or supraclavicular bruits Cardiovascular: regular rate and rhythm, no murmurs Musculoskeletal: no deformity Skin:  no rash/petichiae Vascular:  Normal pulses all extremities Filed Vitals:   12/22/14 0925  BP: 115/75  Pulse: 93   Neurologic Exam Mental Status: Awake and fully alert. Oriented to place and time. Recent and remote memory intact. Attention span, concentration and fund of knowledge appropriate. Mood and affect appropriate.  Cranial Nerves: Fundoscopic exam reveals sharp disc margins. Pupils equal, briskly reactive to light. Extraocular movements full without nystagmus. Visual fields full to confrontation. Hearing intact. Facial sensation intact. Face, tongue, palate moves normally and symmetrically.  Motor: Normal bulk and tone. Normal strength in all tested extremity muscles. Sensory.: intact to  touch ,pinprick .position and vibratory sensation.  Coordination: Rapid alternating movements normal in all extremities. Finger-to-nose and heel-to-shin performed accurately bilaterally. Gait and Station: Arises from chair without difficulty. Stance is normal. Gait demonstrates normal stride length and balance . Able to heel, toe and tandem walk without difficulty.  Reflexes: 1+ and symmetric. Toes downgoing.   NIHSS  0 Modified Rankin  0  ASSESSMENT: 89 year Caucasian lady with right frontal embolic stroke of unknown source in May 2016. She is doing quite well clinically. Vascular risk factors of hypertension and hyperlipidemia only.     PLAN: I had a long d/w patient and husband about her recent stroke, risk for recurrent stroke/TIAs, personally independently reviewed imaging studies and stroke evaluation results and answered questions.Continue Plavix  for secondary stroke prevention and maintain strict control of hypertension with blood pressure goal below 130/90, diabetes with hemoglobin A1c goal below 6.5% and lipids with LDL cholesterol goal below 100 mg/dL. I also  advised the patient to eat a healthy diet with plenty of whole grains, cereals, fruits and vegetables, exercise regularly and maintain ideal body weight. Consider possible participation in the [place] trial if interested. Patient was given written information to review at home and call us if he wants to participate. Followup in the future with me in  3 months or call earlier if necessary. Antony Contras, MD Medical Director [facility] Pager: [phone] 12/22/2014 2:46 PM   Note: This document was prepared with digital dictation and possible smart phrase technology. Any transcriptional errors that result from this process are unintentional

## 2014-12-24 ENCOUNTER — Other Ambulatory Visit (INDEPENDENT_AMBULATORY_CARE_PROVIDER_SITE_OTHER): Payer: Self-pay

## 2014-12-24 ENCOUNTER — Ambulatory Visit (INDEPENDENT_AMBULATORY_CARE_PROVIDER_SITE_OTHER): Payer: 59 | Admitting: *Deleted

## 2014-12-24 DIAGNOSIS — Z0289 Encounter for other administrative examinations: Secondary | ICD-10-CM

## 2014-12-24 DIAGNOSIS — I639 Cerebral infarction, unspecified: Secondary | ICD-10-CM

## 2014-12-25 ENCOUNTER — Encounter: Payer: Self-pay | Admitting: Internal Medicine

## 2014-12-25 LAB — LIPID PANEL
CHOLESTEROL TOTAL: 157 mg/dL (ref 100–199)
Chol/HDL Ratio: 2.5 ratio units (ref 0.0–4.4)
HDL: 62 mg/dL (ref >39)
LDL Calculated: 78 mg/dL (ref 0–99)
Triglycerides: 86 mg/dL (ref 0–149)
VLDL Cholesterol Cal: 17 mg/dL (ref 5–40)

## 2014-12-25 NOTE — Progress Notes (Signed)
Loop recorder 

## 2014-12-30 NOTE — Telephone Encounter (Signed)
Talk to  [place] and informed her that blood work for lipids is satisfactory except LDL cholesterol is slightly borderline but acceptable at 78 Patient understands all of the results.She did not have any questions.

## 2015-01-01 ENCOUNTER — Telehealth: Payer: Self-pay | Admitting: Neurology

## 2015-01-01 LAB — CUP PACEART REMOTE DEVICE CHECK: Date Time Interrogation Session: 20160811103842

## 2015-01-01 NOTE — Telephone Encounter (Signed)
Shannon Collins discussed the case with me. Pt is on plavix for stroke prevention. As far as I understand, for dental tooth preventive cleaning, pt does not need to stop antiplatelet including ASA and plavix. I will clear her for dental cleaning. I let Shannon Collins to tell the pt that if her dentist still has concerns for this, please ask them to contact us. Thanks.  Shannon Hawking, MD PhD Stroke Neurology 01/01/2015 11:22 AM

## 2015-01-01 NOTE — Telephone Encounter (Signed)
Amy Librarian, academic) with  [employer] needs clearance to do cleaning. Patient is in the office now.   Please call  [phone] -

## 2015-01-01 NOTE — Telephone Encounter (Signed)
Talk to Dr.Xu about patient needing approval for cleaning. Dr Erlinda Hong evaluated patients chart and medications. Dr. Erlinda Hong stated patient can get her cleaning at [facility] today. Rn call Amy(dental hygienist) and told her per Dr. Erlinda Hong , Shannon Collins can get her cleaning.

## 2015-01-21 ENCOUNTER — Encounter: Payer: Self-pay | Admitting: Neurology

## 2015-01-22 ENCOUNTER — Encounter: Payer: Self-pay | Admitting: Internal Medicine

## 2015-02-10 ENCOUNTER — Telehealth: Payer: Self-pay | Admitting: Neurology

## 2015-02-10 NOTE — Telephone Encounter (Signed)
Patient called, states she recently had teeth cleaned and had to have permission from Dr. Leonie Man to have teeth cleaned. She is now considering a Colonoscopy and is wondering if she will need permission from Dr. Leonie Man to do the Colonoscopy. Please call patient to advise.

## 2015-02-10 NOTE — Telephone Encounter (Signed)
Rn call patient back to discuss the clearance for her colonoscopy she needs to schedule. Rn explain that Dr.Sethi needs to clear her first for the procedure base on her health and medication list. Rn gave patient fax number for form to be fax to for approval. Pt understood the reason for the clearance.

## 2015-02-23 ENCOUNTER — Ambulatory Visit (INDEPENDENT_AMBULATORY_CARE_PROVIDER_SITE_OTHER): Payer: 59 | Admitting: *Deleted

## 2015-02-23 DIAGNOSIS — I639 Cerebral infarction, unspecified: Secondary | ICD-10-CM

## 2015-02-24 NOTE — Progress Notes (Signed)
Loop recorder 

## 2015-02-25 LAB — CUP PACEART REMOTE DEVICE CHECK: Date Time Interrogation Session: 20161002133546

## 2015-02-25 NOTE — Progress Notes (Signed)
[facility] summary report received. Battery status OK. Normal device function. No new symptom episodes, tachy episodes, brady, or pause episodes. No new AF episodes. Monthly summary reports and ROV w/ GT PRN.

## 2015-03-11 ENCOUNTER — Encounter: Payer: Self-pay | Admitting: Internal Medicine

## 2015-03-17 ENCOUNTER — Ambulatory Visit (INDEPENDENT_AMBULATORY_CARE_PROVIDER_SITE_OTHER): Payer: 59 | Admitting: Nurse Practitioner

## 2015-03-17 ENCOUNTER — Encounter: Payer: Self-pay | Admitting: Nurse Practitioner

## 2015-03-17 VITALS — BP 122/76 | HR 72 | Ht 63.75 in | Wt 133.0 lb

## 2015-03-17 DIAGNOSIS — E785 Hyperlipidemia, unspecified: Secondary | ICD-10-CM

## 2015-03-17 DIAGNOSIS — I639 Cerebral infarction, unspecified: Secondary | ICD-10-CM | POA: Diagnosis not present

## 2015-03-17 NOTE — Progress Notes (Signed)
[facility]  PATIENT: Shannon Collins DOB: [birth year]   REASON FOR VISIT: Follow-up for stroke 09/24/14 HISTORY FROM: Patient    HISTORY OF PRESENT ILLNESS: Shannon Collins is an 64 y.o. female seen today for first office follow-up visit following hospital admission for stroke on 09/24/14. She had past medical history significant for hypercholesterolemia, thyroid disease, asthma, and RA, who was in her usual state of health until 10:30 or 11 pm on 09/23/14 when developed sudden onset of weakness and numbness of the left arm, " like my arm was dead". Stated that she never had similar symptoms before. Said that when she weakness became apparent she couldn't to hold a glass of water unless she use both hands, and was even not capable of opening a door with her left hand. She went to her bedroom and woke up her husband who noticed that she had obviouos weakness of the left arm but he did not notice weakness of the left leg or face, slurrred speech, confusion, or imbalance. Patient denied associated HA, vertigo, double vision, language or vision impairment. By the time she presented to [facility] her symptoms were already resolving (NIHSS 1) and hence she was not considered a proper candidate for thrombolysis. CT brain was reviewed and revealed no acute abnormality.MRI HEAD  09/24/2014 1. Small patchy and linear acute ischemic infarcts involving the cortical gray matter and underlying deep white matter of the posterior right frontal lobe as above. No associated hemorrhage or significant mass effect. 2. Mild chronic small vessel ischemic disease.  MRA HEAD  09/24/2014 1. No proximal branch occlusion or hemodynamically significant stenosis identified within the intracranial circulation. 2. Diminutive basilar artery, with the posterior cerebral arteries supplied in large part be a prominent posterior communicating arteries bilaterally.  2D Echocardiogram  - Left ventricle: The cavity size  was normal. Systolic function wasnormal. The estimated ejection fraction was in the range of 55%to 60%. Wall motion was normal; there were no regional wallmotion abnormalities. LDL cholesterol was 96 mg percent and hemoglobin A1c was 5.8. Transesophageal echocardiogram did not show cardiac source of embolism or PFO. Patient underwent loop recorder insertion and so far paroxysmal atrial fibrillation has not yet been found. She states she's done well since discharge is made for neurological recovery and she has no residual neurological symptoms or deficits. She is tolerating Plavix well with only minor bruising but no bleeding episodes. She had trouble tolerating Lipitor 20 mg due to diffuse aches and pains and is doing much better and the dose was reduced to 10 mg a month ago. She states her blood pressure is doing well and it is 115/75 today. She has no new complaints.  UPDATE 03/17/15 Shannon Collins, 64 year old female returns for follow-up.she had a stroke event 10/04/2014. She is tolerating Plavix with only minor bruising and no bleeding episodes. She continues to have problems tolerating Lipitor and is taking the medication every other day. Her blood pressure 122/76 in the office today. She continues to exercise by walking.she has not had further stroke or TIA symptoms. Her loop recorder remains in place and so far no evidence of paroxysmal atrial fibrillation has been found. She is due to have endoscopy and colonoscopy the end of this month.She returns for follow-up   REVIEW OF SYSTEMS: Full 14 system review of systems performed and notable only for those listed, all others are neg:  Constitutional: neg  Cardiovascular: neg Ear/Nose/Throat: neg  Skin: neg Eyes: neg Respiratory: cough Gastroitestinal: constipation Hematology/Lymphatic: neg  Endocrine: neg Musculoskeletal:neg Allergy/Immunology: neg Neurological: neg Psychiatric: neg Sleep : neg   ALLERGIES: Allergies  Allergen Reactions    . Azithromycin     rash  . Biaxin [Clarithromycin]     "does not remember reaction"  . Codeine     Feels like she will "pass out"  . Tramadol Other (See Comments)    Feels as though I will pass out    HOME MEDICATIONS: Outpatient Prescriptions Prior to Visit  Medication Sig Dispense Refill  . albuterol (PROVENTIL HFA;VENTOLIN HFA) 108 (90 BASE) MCG/ACT inhaler Inhale 2 puffs into the lungs every 6 (six) hours as needed for wheezing or shortness of breath.    Marland Kitchen atorvastatin (LIPITOR) 20 MG tablet Take 1 tablet (20 mg total) by mouth daily. (Patient taking differently: Take 10 mg by mouth every other day. Taking every other day) 30 tablet 0  . beta carotene w/minerals (OCUVITE) tablet Take 1 tablet by mouth daily.    . calcium carbonate (OS-CAL) 600 MG TABS tablet Take 1,200 mg by mouth 2 (two) times daily.     . Cholecalciferol (VITAMIN D) 2000 UNITS CAPS Take 2,000 Units by mouth daily.     . clopidogrel (PLAVIX) 75 MG tablet Take 1 tablet (75 mg total) by mouth daily. 30 tablet 0  . cycloSPORINE (RESTASIS) 0.05 % ophthalmic emulsion Place 1 drop into both eyes 2 (two) times daily.     Marland Kitchen GRAPE SEED CR PO Take 1 tablet by mouth daily.     Marland Kitchen HUMIRA 40 MG/0.8ML PSKT Inject 40 mg into the skin every 7 (seven) days. Inject into skin once every other week    . levothyroxine (SYNTHROID, LEVOTHROID) 50 MCG tablet Take 50 mcg by mouth daily before breakfast.    . Multiple Vitamin (MULTIVITAMIN) tablet Take 1 tablet by mouth daily.    . Omega-3 Fatty Acids (FISH OIL) 1000 MG CAPS Take 2,000 capsules by mouth 2 (two) times daily.     Marland Kitchen omeprazole (PRILOSEC) 40 MG capsule Take 1 capsule (40 mg total) by mouth 2 (two) times daily. 60 capsule 0  . potassium gluconate 595 MG TABS tablet Take 595 mg by mouth daily.    . SEROQUEL XR 150 MG 24 hr tablet Take 75 mg by mouth at bedtime.     . vitamin C (ASCORBIC ACID) 500 MG tablet Take 500 mg by mouth daily.    . vitamin E 400 UNIT capsule Take 400 Units  by mouth daily.    . mometasone (NASONEX) 50 MCG/ACT nasal spray Place into the nose daily.     No facility-administered medications prior to visit.    PAST MEDICAL HISTORY: Past Medical History  Diagnosis Date  . Asthma   . Rheumatoid arthritis ([place])   . High cholesterol   . Thyroid condition   . Stroke [facility])     PAST SURGICAL HISTORY: Past Surgical History  Procedure Laterality Date  . Foot surgery      x2  . Cesarean section    . Ep implantable device N/A 09/25/2014    Procedure: Loop Recorder Insertion;  Surgeon: Evans Lance, MD;  Location: [place] INVASIVE CV LAB CUPID;  Service: Cardiovascular;  Laterality: N/A;  . Tee without cardioversion N/A 09/25/2014    Procedure: TRANSESOPHAGEAL ECHOCARDIOGRAM (TEE);  Surgeon: Fay Records, MD;  Location: [facility] ENDOSCOPY;  Service: Cardiovascular;  Laterality: N/A;    FAMILY HISTORY: Family History  Problem Relation Age of Onset  . Asthma Father  SOCIAL HISTORY: Social History   Social History  . Marital Status: Married    Spouse Name: N/A  . Number of Children: 3  . Years of Education: N/A   Occupational History  . retired    Social History Main Topics  . Smoking status: Never Smoker   . Smokeless tobacco: Not on file  . Alcohol Use: No  . Drug Use: No  . Sexual Activity: No   Other Topics Concern  . Not on file   Social History Narrative     PHYSICAL EXAM  Filed Vitals:   03/17/15 0900  BP: 122/76  Pulse: 72  Height: 5' 3.75" (1.619 m)  Weight: 133 lb (60.328 kg)   Body mass index is 23.02 kg/(m^2).  Generalized: Well developed, in no acute distress  Head: normocephalic and atraumatic,. Oropharynx benign  Neck: Supple, no carotid bruits  Cardiac: Regular rate rhythm, no murmur  Musculoskeletal: No deformity Skin no rash/petechia Vascular normal pulses in all extremities  Neurological examination   Mental Status: Awake and fully alert. Oriented to place and time. Recent and remote memory intact.  Attention span, concentration and fund of knowledge appropriate. Mood and affect appropriate.  Cranial Nerves: Pupils equal, briskly reactive to light. Extraocular movements full without nystagmus. Visual fields full to confrontation. Hearing intact. Facial sensation intact. Face, tongue, palate moves normally and symmetrically.  Motor: Normal bulk and tone. Normal strength in all tested extremity muscles. Sensory.: intact to touch ,pinprick .position and vibratory sensation.  Coordination: Rapid alternating movements normal in all extremities. Finger-to-nose and heel-to-shin performed accurately bilaterally. Gait and Station: Arises from chair without difficulty. Stance is normal. Gait demonstrates normal stride length and balance . Able to heel, toe and tandem walk without difficulty.  Reflexes: 1+ and symmetric. Toes downgoing.  DIAGNOSTIC DATA (LABS, IMAGING, TESTING) - I reviewed patient records, labs, notes, testing and imaging myself where available.  Lab Results  Component Value Date   WBC 6.2 09/24/2014   HGB 13.6 09/24/2014   HCT 40.0 09/24/2014   MCV 88.3 09/24/2014   PLT 236 09/24/2014      Component Value Date/Time   NA 140 09/24/2014 0045   K 3.5 09/24/2014 0045   CL 106 09/24/2014 0045   CO2 28 09/24/2014 0027   GLUCOSE 102* 09/24/2014 0045   BUN 16 09/24/2014 0045   CREATININE 0.80 09/24/2014 0045   CALCIUM 9.4 09/24/2014 0027   PROT 7.6 09/24/2014 0027   ALBUMIN 4.2 09/24/2014 0027   AST 46* 09/24/2014 0027   ALT 53 09/24/2014 0027   ALKPHOS 94 09/24/2014 0027   BILITOT 0.5 09/24/2014 0027   GFRNONAA >60 09/24/2014 0027   GFRAA >60 09/24/2014 0027   Lab Results  Component Value Date   CHOL 157 12/24/2014   HDL 62 12/24/2014   LDLCALC 78 12/24/2014   TRIG 86 12/24/2014   CHOLHDL 2.5 12/24/2014   Lab Results  Component Value Date   HGBA1C 5.8* 09/24/2014   ASSESSMENT AND PLAN  64 y.o. 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 Vascular risk factors are hypertension and hyperlipidemia. She has not had recurrent stroke or TIA symptoms  Continue to monitor risk factors to prevent stroke. Continue Plavix for secondary stroke prevention Keep blood pressure systolic less than 951 today's reading 122/76 LDL cholesterol less than  100 Total cholesterol less than 200 Exercise daily for overall health and well-being Follow heart healthy diet Clearance  for endoscopy and colonoscopy at the end of the month. Patient made aware she needs to stay off of the Plavix for 5 days prior to the procedure and there is a slight risk that she could have a stroke or TIA. She verbalizes understanding Follow-up in 6 monthsVst time 25 min Dennie Bible, [facility], [facility], APRN  [facility] [address] [place], [place] [address] [phone]

## 2015-03-17 NOTE — Patient Instructions (Signed)
Continue Plavix for secondary stroke prevention Keep blood pressure systolic less than 360 LDL cholesterol less than  100 Total cholesterol less than 200 Exercise daily for overall health and well-being Follow-up in 6 months

## 2015-03-18 NOTE — Progress Notes (Signed)
I agree with the above plan 

## 2015-03-24 ENCOUNTER — Ambulatory Visit (INDEPENDENT_AMBULATORY_CARE_PROVIDER_SITE_OTHER): Payer: 59 | Admitting: *Deleted

## 2015-03-24 DIAGNOSIS — I639 Cerebral infarction, unspecified: Secondary | ICD-10-CM | POA: Diagnosis not present

## 2015-03-24 NOTE — Progress Notes (Signed)
Loop recorder 

## 2015-03-30 ENCOUNTER — Ambulatory Visit: Payer: 59 | Admitting: Neurology

## 2015-04-13 ENCOUNTER — Telehealth: Payer: Self-pay | Admitting: Neurology

## 2015-04-13 NOTE — Telephone Encounter (Signed)
Rn call Shannon Collins back will fax form. Rn explain Dr.Sethi was not in office in last week he was in the hospital seeing stroke patients. Shannon Collins stated she understood. Rn stated form will be fax today.

## 2015-04-13 NOTE — Telephone Encounter (Signed)
Shannon Collins with Dr Collene Mares office called pt is scheduled to have endoscopy on 11/28. Need clearance for clopidogrel (PLAVIX) 75 MG tablet . She can be reached at [phone]

## 2015-04-18 LAB — CUP PACEART REMOTE DEVICE CHECK: Date Time Interrogation Session: 20161101133618

## 2015-04-18 NOTE — Progress Notes (Signed)
Carelink summary report received. Battery status OK. Normal device function. No new symptom episodes, tachy episodes, brady, or pause episodes. No new AF episodes. Monthly summary reports and ROV with GT PRN. 

## 2015-04-23 ENCOUNTER — Ambulatory Visit (INDEPENDENT_AMBULATORY_CARE_PROVIDER_SITE_OTHER): Payer: 59 | Admitting: *Deleted

## 2015-04-23 DIAGNOSIS — I639 Cerebral infarction, unspecified: Secondary | ICD-10-CM

## 2015-04-23 NOTE — Progress Notes (Signed)
Carelink Summary Report / Loop Recorder 

## 2015-05-26 ENCOUNTER — Ambulatory Visit (INDEPENDENT_AMBULATORY_CARE_PROVIDER_SITE_OTHER): Payer: 59 | Admitting: *Deleted

## 2015-05-26 DIAGNOSIS — I639 Cerebral infarction, unspecified: Secondary | ICD-10-CM | POA: Diagnosis not present

## 2015-05-26 NOTE — Progress Notes (Signed)
Carelink Summary Report / Loop Recorder 

## 2015-06-22 ENCOUNTER — Ambulatory Visit (INDEPENDENT_AMBULATORY_CARE_PROVIDER_SITE_OTHER): Payer: 59 | Admitting: *Deleted

## 2015-06-22 DIAGNOSIS — I639 Cerebral infarction, unspecified: Secondary | ICD-10-CM | POA: Diagnosis not present

## 2015-06-22 NOTE — Progress Notes (Signed)
Carelink Summary Report / Loop Recorder 

## 2015-07-01 LAB — CUP PACEART REMOTE DEVICE CHECK: MDC IDC SESS DTM: 20161231140605

## 2015-07-22 ENCOUNTER — Ambulatory Visit (INDEPENDENT_AMBULATORY_CARE_PROVIDER_SITE_OTHER): Payer: 59 | Admitting: *Deleted

## 2015-07-22 DIAGNOSIS — I639 Cerebral infarction, unspecified: Secondary | ICD-10-CM | POA: Diagnosis not present

## 2015-07-22 NOTE — Progress Notes (Signed)
Carelink Summary Report / Loop Recorder 

## 2015-07-28 LAB — CUP PACEART REMOTE DEVICE CHECK: MDC IDC SESS DTM: 20170130143735

## 2015-07-28 NOTE — Progress Notes (Signed)
Carelink summary report received. Battery status OK. Normal device function. No new symptom episodes, tachy episodes, brady, or pause episodes. No new AF episodes. Monthly summary reports and ROV/PRN 

## 2015-08-01 LAB — CUP PACEART REMOTE DEVICE CHECK: MDC IDC SESS DTM: 20170301150735

## 2015-08-01 NOTE — Progress Notes (Signed)
Carelink summary report received. Battery status OK. Normal device function. No new symptom episodes, tachy episodes, brady, or pause episodes. No new AF episodes. Monthly summary reports and ROV/PRN 

## 2015-08-21 ENCOUNTER — Ambulatory Visit (INDEPENDENT_AMBULATORY_CARE_PROVIDER_SITE_OTHER): Payer: 59 | Admitting: *Deleted

## 2015-08-21 DIAGNOSIS — I639 Cerebral infarction, unspecified: Secondary | ICD-10-CM | POA: Diagnosis not present

## 2015-08-21 NOTE — Progress Notes (Signed)
Carelink Summary Report / Loop Recorder 

## 2015-09-14 ENCOUNTER — Encounter: Payer: Self-pay | Admitting: Nurse Practitioner

## 2015-09-14 ENCOUNTER — Ambulatory Visit (INDEPENDENT_AMBULATORY_CARE_PROVIDER_SITE_OTHER): Payer: 59 | Admitting: Nurse Practitioner

## 2015-09-14 VITALS — BP 112/65 | HR 70 | Ht 63.75 in | Wt 131.4 lb

## 2015-09-14 DIAGNOSIS — I639 Cerebral infarction, unspecified: Secondary | ICD-10-CM | POA: Diagnosis not present

## 2015-09-14 DIAGNOSIS — E785 Hyperlipidemia, unspecified: Secondary | ICD-10-CM | POA: Diagnosis not present

## 2015-09-14 MED ORDER — CLOPIDOGREL BISULFATE 75 MG PO TABS: 75.0000 mg | ORAL_TABLET | Freq: Every day | ORAL | Status: DC

## 2015-09-14 NOTE — Progress Notes (Signed)
[facility]  PATIENT: Shannon Collins DOB: 09/10/1950   REASON FOR VISIT: Follow-up for stroke, hyperlipidemia HISTORY FROM: Patient    HISTORY OF PRESENT ILLNESS: HISTORY:Shannon Collins is an 65 y.o. female seen today for first office follow-up visit following hospital admission for stroke on 09/24/14. She had past medical history significant for hypercholesterolemia, thyroid disease, asthma, and RA, who was in her usual state of health until 10:30 or 11 pm on 09/23/14 when developed sudden onset of weakness and numbness of the left arm, " like my arm was dead". Stated that she never had similar symptoms before. Said that when she weakness became apparent she couldn't to hold a glass of water unless she use both hands, and was even not capable of opening a door with her left hand. She went to her bedroom and woke up her husband who noticed that she had obviouos weakness of the left arm but he did not notice weakness of the left leg or face, slurrred speech, confusion, or imbalance. Patient denied associated HA, vertigo, double vision, language or vision impairment. By the time she presented to [facility] her symptoms were already resolving (NIHSS 1) and hence she was not considered a proper candidate for thrombolysis. CT brain was reviewed and revealed no acute abnormality.MRI HEAD  09/24/2014 1. Small patchy and linear acute ischemic infarcts involving the cortical gray matter and underlying deep white matter of the posterior right frontal lobe as above. No associated hemorrhage or significant mass effect. 2. Mild chronic small vessel ischemic disease.  MRA HEAD  09/24/2014 1. No proximal branch occlusion or hemodynamically significant stenosis identified within the intracranial circulation. 2. Diminutive basilar artery, with the posterior cerebral arteries supplied in large part be a prominent posterior communicating arteries bilaterally.  2D Echocardiogram  - Left ventricle:  The cavity size was normal. Systolic function wasnormal. The estimated ejection fraction was in the range of 55%to 60%. Wall motion was normal; there were no regional wallmotion abnormalities. LDL cholesterol was 96 mg percent and hemoglobin A1c was 5.8. Transesophageal echocardiogram did not show cardiac source of embolism or PFO. Patient underwent loop recorder insertion and so far paroxysmal atrial fibrillation has not yet been found. She states she's done well since discharge is made for neurological recovery and she has no residual neurological symptoms or deficits. She is tolerating Plavix well with only minor bruising but no bleeding episodes. She had trouble tolerating Lipitor 20 mg due to diffuse aches and pains and is doing much better and the dose was reduced to 10 mg a month ago. She states her blood pressure is doing well and it is 115/75 today. She has no new complaints.  UPDATE 03/17/15 CMMs. [name], 65 year old female returns for follow-up.she had a stroke event 09/24/2014. She is tolerating Plavix with only minor bruising and no bleeding episodes. She continues to have problems tolerating Lipitor and is taking the medication every other day. Her blood pressure 122/76 in the office today. She continues to exercise by walking.she has not had further stroke or TIA symptoms. Her loop recorder remains in place and so far no evidence of paroxysmal atrial fibrillation has been found. She is due to have endoscopy and colonoscopy the end of this month.She returns for follow-up UPDATE 04/24/2017CM [name], 65 year old female returns for follow-up. She has a history of stroke event which occurred 09/24/2014. She has not had further stroke or TIA symptoms. She is currently on Plavix only minor bruising and bleeding episodes. She is on  Lipitor 10 mg every other day followed by her primary care Dr. Sabra Heck. She continues to exercise by walking the dog. Her loop recorder remains in place and so far no  evidence of atrial fibrillation has been found. Blood pressure is well controlled. She returns for reevaluation  REVIEW OF SYSTEMS: Full 14 system review of systems performed and notable only for those listed, all others are neg:  Constitutional: neg  Cardiovascular: neg Ear/Nose/Throat: neg  Skin: neg Eyes: neg Respiratory: neg Gastroitestinal: neg  Hematology/Lymphatic: neg  Endocrine: neg Musculoskeletal: Muscle cramps Allergy/Immunology: neg Neurological: neg Psychiatric: neg Sleep : neg   ALLERGIES: Allergies  Allergen Reactions  . Azithromycin     rash  . Biaxin [Clarithromycin]     "does not remember reaction"  . Codeine     Feels like she will "pass out"  . Tramadol Other (See Comments)    Feels as though I will pass out    HOME MEDICATIONS: Outpatient Prescriptions Prior to Visit  Medication Sig Dispense Refill  . beta carotene w/minerals (OCUVITE) tablet Take 1 tablet by mouth daily.    . calcium carbonate (OS-CAL) 600 MG TABS tablet Take 1,200 mg by mouth 2 (two) times daily.     . Cholecalciferol (VITAMIN D) 2000 UNITS CAPS Take 2,000 Units by mouth daily.     . clopidogrel (PLAVIX) 75 MG tablet Take 1 tablet (75 mg total) by mouth daily. 30 tablet 0  . cycloSPORINE (RESTASIS) 0.05 % ophthalmic emulsion Place 1 drop into both eyes 2 (two) times daily.     . fluticasone (FLONASE) 50 MCG/ACT nasal spray Place 1 spray into both nostrils daily.    Marland Kitchen GRAPE SEED CR PO Take 1 tablet by mouth daily.     Marland Kitchen HUMIRA 40 MG/0.8ML PSKT Inject 40 mg into the skin every 7 (seven) days. Inject into skin once every other week    . levothyroxine (SYNTHROID, LEVOTHROID) 50 MCG tablet Take 50 mcg by mouth daily before breakfast.    . Omega-3 Fatty Acids (FISH OIL) 1000 MG CAPS Take 2,000 capsules by mouth 2 (two) times daily.     Marland Kitchen omeprazole (PRILOSEC) 40 MG capsule Take 1 capsule (40 mg total) by mouth 2 (two) times daily. 60 capsule 0  . potassium gluconate 595 MG TABS tablet  Take 595 mg by mouth daily.    . vitamin C (ASCORBIC ACID) 500 MG tablet Take 500 mg by mouth daily.    . vitamin E 400 UNIT capsule Take 400 Units by mouth daily.    Marland Kitchen atorvastatin (LIPITOR) 20 MG tablet Take 1 tablet (20 mg total) by mouth daily. (Patient taking differently: Take 10 mg by mouth every other day. Taking every other day) 30 tablet 0  . SEROQUEL XR 150 MG 24 hr tablet Take 75 mg by mouth at bedtime.     Marland Kitchen albuterol (PROVENTIL HFA;VENTOLIN HFA) 108 (90 BASE) MCG/ACT inhaler Inhale 2 puffs into the lungs every 6 (six) hours as needed for wheezing or shortness of breath.    . Multiple Vitamin (MULTIVITAMIN) tablet Take 1 tablet by mouth daily.     No facility-administered medications prior to visit.    PAST MEDICAL HISTORY: Past Medical History  Diagnosis Date  . Asthma   . Rheumatoid arthritis ([place])   . High cholesterol   . Thyroid condition   . Stroke [facility])     PAST SURGICAL HISTORY: Past Surgical History  Procedure Laterality Date  . Foot surgery  x2  . Cesarean section    . Ep implantable device N/A 09/25/2014    Procedure: Loop Recorder Insertion;  Surgeon: Evans Lance, MD;  Location: [place] INVASIVE CV LAB CUPID;  Service: Cardiovascular;  Laterality: N/A;  . Tee without cardioversion N/A 09/25/2014    Procedure: TRANSESOPHAGEAL ECHOCARDIOGRAM (TEE);  Surgeon: Fay Records, MD;  Location: [facility] ENDOSCOPY;  Service: Cardiovascular;  Laterality: N/A;    FAMILY HISTORY: Family History  Problem Relation Age of Onset  . Asthma Father     SOCIAL HISTORY: Social History   Social History  . Marital Status: Married    Spouse Name: N/A  . Number of Children: 3  . Years of Education: N/A   Occupational History  . retired    Social History Main Topics  . Smoking status: Never Smoker   . Smokeless tobacco: Not on file  . Alcohol Use: No  . Drug Use: No  . Sexual Activity: No   Other Topics Concern  . Not on file   Social History Narrative     PHYSICAL  EXAM  Filed Vitals:   09/14/15 0918  BP: 112/65  Pulse: 70  Height: 5' 3.75" (1.619 m)  Weight: 131 lb 6.4 oz (59.603 kg)   Body mass index is 22.74 kg/(m^2). Generalized: Well developed, in no acute distress  Head: normocephalic and atraumatic,. Oropharynx benign  Neck: Supple, no carotid bruits  Cardiac: Regular rate rhythm, no murmur  Musculoskeletal: No deformity Skin no rash/petechia Vascular normal pulses in all extremities  Neurological examination   Mental Status: Awake and fully alert. Oriented to place and time. Recent and remote memory intact. Attention span, concentration and fund of knowledge appropriate. Mood and affect appropriate.  Cranial Nerves: Pupils equal, briskly reactive to light. Extraocular movements full without nystagmus. Visual fields full to confrontation. Hearing intact. Facial sensation intact. Face, tongue, palate moves normally and symmetrically.  Motor: Normal bulk and tone. Normal strength in all tested extremity muscles. Sensory.: intact to touch ,pinprick .position and vibratory sensation.  Coordination: Rapid alternating movements normal in all extremities. Finger-to-nose and heel-to-shin performed accurately bilaterally. Gait and Station: Arises from chair without difficulty. Stance is normal. Gait demonstrates normal stride length and balance . Able to heel, toe and tandem walk without difficulty.  Reflexes: 1+ and symmetric. Toes downgoing.  DIAGNOSTIC DATA (LABS, IMAGING, TESTING) -  ASSESSMENT AND PLAN 65 y.o. year old female has a past medical history of right frontal embolic stroke of unknown source May 2016. Vascular risk factors are hypertension and hyperlipidemia. She has not had recurrent stroke or TIA symptoms  Continue to monitor risk factors to prevent stroke. Keep systolic blood pressure less than 130, today's reading112/65 Lipids are followed by Dr. Sabra Heck continue Lipitor for secondary stroke prevention Continue  Plavix for  secondary stroke prevention, will renew for 1 year then obtain from Dr. Sabra Heck No further stroke or TIA symptoms since May 2016 Continue healthy diet and exercise for overall health and well-being If recurrent stroke symptoms occur, call 911 and proceed to the hospital Discharge from neurologic services at this time Dennie Bible, [facility], [facility], [facility] [address], [place], [place] [address] [phone]

## 2015-09-14 NOTE — Patient Instructions (Signed)
Keep systolic blood pressure less than 130, today's reading112/65 Lipids are followed by Dr. Sabra Heck continue Lipitor for secondary stroke prevention Continue Plavix for  secondary stroke prevention, will renew for 1 year No further stroke or TIA symptoms since May 2016 Continue healthy diet and exercise for overall health and well-being If recurrent stroke symptoms occur, call 911 and proceed to the hospital Discharge from neurologic services at this time

## 2015-09-15 NOTE — Progress Notes (Signed)
I agree with the above plan 

## 2015-09-21 ENCOUNTER — Ambulatory Visit (INDEPENDENT_AMBULATORY_CARE_PROVIDER_SITE_OTHER): Payer: 59 | Admitting: *Deleted

## 2015-09-21 DIAGNOSIS — I639 Cerebral infarction, unspecified: Secondary | ICD-10-CM

## 2015-09-21 NOTE — Progress Notes (Signed)
Carelink Summary Report / Loop Recorder 

## 2015-10-17 LAB — CUP PACEART REMOTE DEVICE CHECK: MDC IDC SESS DTM: 20170331151554

## 2015-10-17 NOTE — Progress Notes (Signed)
Carelink summary report received. Battery status OK. Normal device function. No new symptom episodes, tachy episodes, brady, or pause episodes. No new AF episodes. Monthly summary reports and ROV/PRN 

## 2015-10-20 ENCOUNTER — Ambulatory Visit (INDEPENDENT_AMBULATORY_CARE_PROVIDER_SITE_OTHER): Payer: 59 | Admitting: *Deleted

## 2015-10-20 DIAGNOSIS — I639 Cerebral infarction, unspecified: Secondary | ICD-10-CM | POA: Diagnosis not present

## 2015-10-21 NOTE — Progress Notes (Signed)
Carelink Summary Report / Loop Recorder 

## 2015-10-27 ENCOUNTER — Other Ambulatory Visit: Payer: Self-pay | Admitting: Family Medicine

## 2015-10-27 DIAGNOSIS — K219 Gastro-esophageal reflux disease without esophagitis: Secondary | ICD-10-CM

## 2015-10-30 LAB — CUP PACEART REMOTE DEVICE CHECK: Date Time Interrogation Session: 20170430153521

## 2015-11-04 ENCOUNTER — Ambulatory Visit
Admission: RE | Admit: 2015-11-04 | Discharge: 2015-11-04 | Disposition: A | Discharge status: Home / Self Care | Payer: 59 | Source: Ambulatory Visit | Attending: Family Medicine | Admitting: Family Medicine

## 2015-11-04 DIAGNOSIS — K219 Gastro-esophageal reflux disease without esophagitis: Secondary | ICD-10-CM

## 2015-11-05 ENCOUNTER — Encounter: Payer: Self-pay | Admitting: Internal Medicine

## 2015-11-16 ENCOUNTER — Encounter: Payer: Self-pay | Admitting: Internal Medicine

## 2015-11-16 ENCOUNTER — Ambulatory Visit (INDEPENDENT_AMBULATORY_CARE_PROVIDER_SITE_OTHER): Payer: 59 | Admitting: Internal Medicine

## 2015-11-16 VITALS — BP 128/84 | HR 78 | Ht 63.0 in | Wt 124.8 lb

## 2015-11-16 DIAGNOSIS — R55 Syncope and collapse: Secondary | ICD-10-CM | POA: Diagnosis not present

## 2015-11-16 NOTE — Progress Notes (Signed)
HPI Shannon Collins returns today for followup. She is a pleasant 65 yo woman with a cryptogenic stroke sustained over a year ago. In the interim, she has had an ILR inserted. She is interested in having the device removed. She also give a remote h/o syncope. She has had neither in the past year and otherwise feels well. Allergies  Allergen Reactions  . Azithromycin     rash  . Biaxin [Clarithromycin]     "does not remember reaction"  . Codeine     Feels like she will "pass out"  . Tramadol Other (See Comments)    Feels as though I will pass out     Current Outpatient Prescriptions  Medication Sig Dispense Refill  . atorvastatin (LIPITOR) 10 MG tablet Take 10 mg by mouth every other day.    . beta carotene w/minerals (OCUVITE) tablet Take 1 tablet by mouth daily.    . calcium carbonate (OS-CAL) 600 MG TABS tablet Take 1,200 mg by mouth 2 (two) times daily.     . Cholecalciferol (VITAMIN D) 2000 UNITS CAPS Take 2,000 Units by mouth daily.     . clopidogrel (PLAVIX) 75 MG tablet Take 1 tablet (75 mg total) by mouth daily. 30 tablet 11  . cycloSPORINE (RESTASIS) 0.05 % ophthalmic emulsion Place 1 drop into both eyes 2 (two) times daily.     . fluticasone (FLONASE) 50 MCG/ACT nasal spray Place 1 spray into both nostrils daily.    Marland Kitchen GRAPE SEED CR PO Take 1 tablet by mouth daily.     Marland Kitchen HUMIRA 40 MG/0.8ML PSKT Inject 40 mg into the skin every 7 (seven) days. Inject into skin once every other week    . levothyroxine (SYNTHROID, LEVOTHROID) 50 MCG tablet Take 50 mcg by mouth daily before breakfast.    . Omega-3 Fatty Acids (FISH OIL) 1000 MG CAPS Take 2,000 capsules by mouth 2 (two) times daily.     Marland Kitchen omeprazole (PRILOSEC) 40 MG capsule Take 1 capsule (40 mg total) by mouth 2 (two) times daily. 60 capsule 0  . potassium gluconate 595 MG TABS tablet Take 595 mg by mouth daily.    . QUEtiapine Fumarate (SEROQUEL PO) Take 75 mg by mouth daily.    . vitamin C (ASCORBIC ACID) 500 MG tablet Take  500 mg by mouth daily.    . vitamin E 400 UNIT capsule Take 400 Units by mouth daily.     No current facility-administered medications for this visit.     Past Medical History  Diagnosis Date  . Asthma   . Rheumatoid arthritis ([place])   . High cholesterol   . Thyroid condition   . Stroke ([place])     ROS:   All systems reviewed and negative except as noted in the HPI.   Past Surgical History  Procedure Laterality Date  . Foot surgery      x2  . Cesarean section    . Ep implantable device N/A 09/25/2014    Procedure: Loop Recorder Insertion;  Surgeon: Evans Lance, MD;  Location: [facility] CV [facility] CUPID;  Service: Cardiovascular;  Laterality: N/A;  . Tee without cardioversion N/A 09/25/2014    Procedure: TRANSESOPHAGEAL ECHOCARDIOGRAM (TEE);  Surgeon: Fay Records, MD;  Location: [facility] ENDOSCOPY;  Service: Cardiovascular;  Laterality: N/A;     Family History  Problem Relation Age of Onset  . Asthma Father      Social History   Social History  . Marital Status:  Married    Spouse Name: N/A  . Number of Children: 3  . Years of Education: N/A   Occupational History  . retired    Social History Main Topics  . Smoking status: Never Smoker   . Smokeless tobacco: Not on file  . Alcohol Use: No  . Drug Use: No  . Sexual Activity: No   Other Topics Concern  . Not on file   Social History Narrative     BP 128/84 mmHg  Pulse 78  Ht 5\' 3"  (1.6 m)  Wt 124 lb 12.8 oz (56.609 kg)  BMI 22.11 kg/m2  Physical Exam:  Well appearing 65 yo woman, NAD HEENT: Unremarkable Neck:  6 cm JVD, no thyromegally Lymphatics:  No adenopathy Back:  No CVA tenderness Lungs:  Clear with no wheezes HEART:  Regular rate rhythm, no murmurs, no rubs, no clicks Abd:  soft, positive bowel sounds, no organomegally, no rebound, no guarding Ext:  2 plus pulses, no edema, no cyanosis, no clubbing Skin:  No rashes no nodules Neuro:  CN II through XII intact, motor grossly intact  EKG -  nsr   Assess/Plan: 1. Cryptogenic stroke - we discussed the pro's and con's of removing her ILR. She would like to keep it in for now. I have reassured her that she may proceed with a  Mamogram. 2. Syncope -she has not had an episode in several years. Will follow. 3. Dyslipidemia - she will continue her statin therapy.

## 2015-11-16 NOTE — Patient Instructions (Addendum)
Medication Instructions:  Your physician recommends that you continue on your current medications as directed. Please refer to the Current Medication list given to you today.   Labwork: NONE  Testing/Procedures: NONE  Follow-Up: Your physician wants you to follow-up in: YEAR WITH  DR  TAYLOR  You will receive a reminder letter in the mail two months in advance. If you don't receive a letter, please call our office to schedule the follow-up appointment.  Any Other Special Instructions Will Be Listed Below (If Applicable).     If you need a refill on your cardiac medications before your next appointment, please call your pharmacy.   

## 2015-11-19 ENCOUNTER — Ambulatory Visit (INDEPENDENT_AMBULATORY_CARE_PROVIDER_SITE_OTHER): Payer: 59 | Admitting: *Deleted

## 2015-11-19 DIAGNOSIS — I639 Cerebral infarction, unspecified: Secondary | ICD-10-CM

## 2015-11-19 NOTE — Progress Notes (Signed)
Carelink Summary Report / Loop Recorder 

## 2015-11-27 LAB — CUP PACEART REMOTE DEVICE CHECK: Date Time Interrogation Session: 20170530160641

## 2015-11-30 ENCOUNTER — Ambulatory Visit: Payer: Self-pay | Admitting: Surgery

## 2015-12-11 LAB — CUP PACEART REMOTE DEVICE CHECK: Date Time Interrogation Session: 20170629163808

## 2015-12-21 ENCOUNTER — Ambulatory Visit (INDEPENDENT_AMBULATORY_CARE_PROVIDER_SITE_OTHER): Payer: 59 | Admitting: *Deleted

## 2015-12-21 DIAGNOSIS — I639 Cerebral infarction, unspecified: Secondary | ICD-10-CM

## 2015-12-21 NOTE — Progress Notes (Signed)
Carelink Summary Report / Loop Recorder 

## 2015-12-28 LAB — CUP PACEART REMOTE DEVICE CHECK: MDC IDC SESS DTM: 20170729171034

## 2016-01-18 ENCOUNTER — Ambulatory Visit (INDEPENDENT_AMBULATORY_CARE_PROVIDER_SITE_OTHER): Payer: 59 | Admitting: *Deleted

## 2016-01-18 DIAGNOSIS — I639 Cerebral infarction, unspecified: Secondary | ICD-10-CM | POA: Diagnosis not present

## 2016-01-19 NOTE — Progress Notes (Signed)
Carelink Summary Report / Loop Recorder 

## 2016-02-13 LAB — CUP PACEART REMOTE DEVICE CHECK: Date Time Interrogation Session: 20170828174131

## 2016-02-13 NOTE — Progress Notes (Signed)
Carelink summary report received. Battery status OK. Normal device function. No new symptom episodes, tachy episodes, brady, or pause episodes. No new AF episodes. Monthly summary reports and ROV/PRN 

## 2016-02-17 ENCOUNTER — Ambulatory Visit (INDEPENDENT_AMBULATORY_CARE_PROVIDER_SITE_OTHER): Payer: 59 | Admitting: *Deleted

## 2016-02-17 DIAGNOSIS — I639 Cerebral infarction, unspecified: Secondary | ICD-10-CM | POA: Diagnosis not present

## 2016-02-17 NOTE — Progress Notes (Signed)
Carelink Summary Report / Loop Recorder 

## 2016-03-18 ENCOUNTER — Ambulatory Visit (INDEPENDENT_AMBULATORY_CARE_PROVIDER_SITE_OTHER): Payer: 59 | Admitting: *Deleted

## 2016-03-18 DIAGNOSIS — I639 Cerebral infarction, unspecified: Secondary | ICD-10-CM | POA: Diagnosis not present

## 2016-03-21 NOTE — Progress Notes (Signed)
Carelink Summary Report / Loop Recorder 

## 2016-04-18 ENCOUNTER — Ambulatory Visit (INDEPENDENT_AMBULATORY_CARE_PROVIDER_SITE_OTHER): Payer: 59 | Admitting: *Deleted

## 2016-04-18 DIAGNOSIS — I639 Cerebral infarction, unspecified: Secondary | ICD-10-CM | POA: Diagnosis not present

## 2016-04-19 LAB — CUP PACEART REMOTE DEVICE CHECK
Implantable Pulse Generator Implant Date: 20160505
MDC IDC SESS DTM: 20171027183756

## 2016-04-19 NOTE — Progress Notes (Signed)
Carelink Summary Report / Loop Recorder 

## 2016-04-19 NOTE — Progress Notes (Signed)
Carelink summary report received. Battery status OK. Normal device function. No new symptom episodes, tachy episodes, brady, or pause episodes. No new AF episodes. Monthly summary reports and ROV/PRN 

## 2016-05-17 ENCOUNTER — Ambulatory Visit (INDEPENDENT_AMBULATORY_CARE_PROVIDER_SITE_OTHER): Payer: 59 | Admitting: *Deleted

## 2016-05-17 DIAGNOSIS — I639 Cerebral infarction, unspecified: Secondary | ICD-10-CM

## 2016-05-18 NOTE — Progress Notes (Signed)
Carelink Summary Report / Loop Recorder 

## 2016-05-27 LAB — CUP PACEART REMOTE DEVICE CHECK
Implantable Pulse Generator Implant Date: 20160505
MDC IDC SESS DTM: 20171126191143

## 2016-06-16 ENCOUNTER — Ambulatory Visit (INDEPENDENT_AMBULATORY_CARE_PROVIDER_SITE_OTHER): Payer: 59 | Admitting: *Deleted

## 2016-06-16 DIAGNOSIS — I639 Cerebral infarction, unspecified: Secondary | ICD-10-CM

## 2016-06-17 NOTE — Progress Notes (Signed)
Carelink Summary Report / Loop Recorder 

## 2016-07-04 LAB — CUP PACEART REMOTE DEVICE CHECK
Date Time Interrogation Session: 20171226193907
Implantable Pulse Generator Implant Date: 20160505

## 2016-07-17 LAB — CUP PACEART REMOTE DEVICE CHECK
Implantable Pulse Generator Implant Date: 20160505
MDC IDC SESS DTM: 20180125203656

## 2016-07-18 ENCOUNTER — Ambulatory Visit (INDEPENDENT_AMBULATORY_CARE_PROVIDER_SITE_OTHER): Payer: 59 | Admitting: *Deleted

## 2016-07-18 DIAGNOSIS — I639 Cerebral infarction, unspecified: Secondary | ICD-10-CM

## 2016-07-18 NOTE — Progress Notes (Signed)
Carelink Summary Report / Loop Recorder 

## 2016-08-05 LAB — CUP PACEART REMOTE DEVICE CHECK
Date Time Interrogation Session: 20180224204253
Implantable Pulse Generator Implant Date: 20160505

## 2016-08-15 ENCOUNTER — Ambulatory Visit (INDEPENDENT_AMBULATORY_CARE_PROVIDER_SITE_OTHER): Payer: 59 | Admitting: *Deleted

## 2016-08-15 DIAGNOSIS — I639 Cerebral infarction, unspecified: Secondary | ICD-10-CM

## 2016-08-16 NOTE — Progress Notes (Signed)
Carelink Summary Report / Loop Recorder 

## 2016-08-31 LAB — CUP PACEART REMOTE DEVICE CHECK
Date Time Interrogation Session: 20180326203925
Implantable Pulse Generator Implant Date: 20160505

## 2016-09-14 ENCOUNTER — Ambulatory Visit (INDEPENDENT_AMBULATORY_CARE_PROVIDER_SITE_OTHER): Payer: Medicare Other | Admitting: *Deleted

## 2016-09-14 DIAGNOSIS — I639 Cerebral infarction, unspecified: Secondary | ICD-10-CM | POA: Diagnosis not present

## 2016-09-15 NOTE — Progress Notes (Signed)
Carelink Summary Report / Loop Recorder 

## 2016-09-20 DIAGNOSIS — M2550 Pain in unspecified joint: Secondary | ICD-10-CM | POA: Diagnosis not present

## 2016-09-20 DIAGNOSIS — Z6821 Body mass index (BMI) 21.0-21.9, adult: Secondary | ICD-10-CM | POA: Diagnosis not present

## 2016-09-20 DIAGNOSIS — Z79899 Other long term (current) drug therapy: Secondary | ICD-10-CM | POA: Diagnosis not present

## 2016-09-20 DIAGNOSIS — M150 Primary generalized (osteo)arthritis: Secondary | ICD-10-CM | POA: Diagnosis not present

## 2016-09-20 DIAGNOSIS — M0579 Rheumatoid arthritis with rheumatoid factor of multiple sites without organ or systems involvement: Secondary | ICD-10-CM | POA: Diagnosis not present

## 2016-10-04 DIAGNOSIS — M0579 Rheumatoid arthritis with rheumatoid factor of multiple sites without organ or systems involvement: Secondary | ICD-10-CM | POA: Diagnosis not present

## 2016-10-05 LAB — CUP PACEART REMOTE DEVICE CHECK
Date Time Interrogation Session: 20180425213727
Implantable Pulse Generator Implant Date: 20160505

## 2016-10-14 ENCOUNTER — Ambulatory Visit (INDEPENDENT_AMBULATORY_CARE_PROVIDER_SITE_OTHER): Payer: Medicare Other | Admitting: *Deleted

## 2016-10-14 DIAGNOSIS — I639 Cerebral infarction, unspecified: Secondary | ICD-10-CM | POA: Diagnosis not present

## 2016-10-18 DIAGNOSIS — M0579 Rheumatoid arthritis with rheumatoid factor of multiple sites without organ or systems involvement: Secondary | ICD-10-CM | POA: Diagnosis not present

## 2016-10-18 DIAGNOSIS — F28 Other psychotic disorder not due to a substance or known physiological condition: Secondary | ICD-10-CM | POA: Diagnosis not present

## 2016-10-18 NOTE — Progress Notes (Signed)
Carelink Summary Report / Loop Recorder 

## 2016-10-20 LAB — CUP PACEART REMOTE DEVICE CHECK
Implantable Pulse Generator Implant Date: 20160505
MDC IDC SESS DTM: 20180525214259

## 2016-11-08 DIAGNOSIS — K219 Gastro-esophageal reflux disease without esophagitis: Secondary | ICD-10-CM | POA: Diagnosis not present

## 2016-11-08 DIAGNOSIS — E039 Hypothyroidism, unspecified: Secondary | ICD-10-CM | POA: Diagnosis not present

## 2016-11-08 DIAGNOSIS — Z79899 Other long term (current) drug therapy: Secondary | ICD-10-CM | POA: Diagnosis not present

## 2016-11-08 DIAGNOSIS — Z23 Encounter for immunization: Secondary | ICD-10-CM | POA: Diagnosis not present

## 2016-11-08 DIAGNOSIS — Z8673 Personal history of transient ischemic attack (TIA), and cerebral infarction without residual deficits: Secondary | ICD-10-CM | POA: Diagnosis not present

## 2016-11-08 DIAGNOSIS — E782 Mixed hyperlipidemia: Secondary | ICD-10-CM | POA: Diagnosis not present

## 2016-11-11 DIAGNOSIS — F28 Other psychotic disorder not due to a substance or known physiological condition: Secondary | ICD-10-CM | POA: Diagnosis not present

## 2016-11-11 DIAGNOSIS — Z79899 Other long term (current) drug therapy: Secondary | ICD-10-CM | POA: Diagnosis not present

## 2016-11-14 ENCOUNTER — Ambulatory Visit (INDEPENDENT_AMBULATORY_CARE_PROVIDER_SITE_OTHER): Payer: Medicare Other | Admitting: *Deleted

## 2016-11-14 DIAGNOSIS — I639 Cerebral infarction, unspecified: Secondary | ICD-10-CM | POA: Diagnosis not present

## 2016-11-14 DIAGNOSIS — Z6822 Body mass index (BMI) 22.0-22.9, adult: Secondary | ICD-10-CM | POA: Diagnosis not present

## 2016-11-14 DIAGNOSIS — Z01419 Encounter for gynecological examination (general) (routine) without abnormal findings: Secondary | ICD-10-CM | POA: Diagnosis not present

## 2016-11-14 NOTE — Progress Notes (Signed)
Carelink Summary Report / Loop Recorder 

## 2016-11-17 DIAGNOSIS — M0579 Rheumatoid arthritis with rheumatoid factor of multiple sites without organ or systems involvement: Secondary | ICD-10-CM | POA: Diagnosis not present

## 2016-11-18 LAB — CUP PACEART REMOTE DEVICE CHECK
MDC IDC PG IMPLANT DT: 20160505
MDC IDC SESS DTM: 20180624221128

## 2016-11-21 ENCOUNTER — Encounter: Payer: Self-pay | Admitting: Internal Medicine

## 2016-11-21 ENCOUNTER — Ambulatory Visit (INDEPENDENT_AMBULATORY_CARE_PROVIDER_SITE_OTHER): Payer: Medicare Other | Admitting: Internal Medicine

## 2016-11-21 VITALS — BP 126/84 | HR 78 | Ht 63.0 in | Wt 127.2 lb

## 2016-11-21 DIAGNOSIS — I639 Cerebral infarction, unspecified: Secondary | ICD-10-CM

## 2016-11-21 DIAGNOSIS — R55 Syncope and collapse: Secondary | ICD-10-CM

## 2016-11-21 NOTE — Progress Notes (Signed)
HPI Shannon Collins returns today for followup. She is a pleasant 66 yo woman with a cryptogenic stroke sustained over 2 years ago. She has started exercising 3 times a week. No edema. No chest pain.  Allergies  Allergen Reactions  . Azithromycin     rash  . Biaxin [Clarithromycin]     "does not remember reaction"  . Codeine     Feels like she will "pass out"  . Tramadol Other (See Comments)    Feels as though I will pass out     Current Outpatient Prescriptions  Medication Sig Dispense Refill  . atorvastatin (LIPITOR) 10 MG tablet Take 10 mg by mouth every other day.    . beta carotene w/minerals (OCUVITE) tablet Take 1 tablet by mouth daily.    . calcium carbonate (OS-CAL) 600 MG TABS tablet Take 1,200 mg by mouth 2 (two) times daily.     . Cholecalciferol (VITAMIN D) 2000 UNITS CAPS Take 2,000 Units by mouth daily.     . clopidogrel (PLAVIX) 75 MG tablet Take 1 tablet (75 mg total) by mouth daily. 30 tablet 11  . cycloSPORINE (RESTASIS) 0.05 % ophthalmic emulsion Place 1 drop into both eyes 2 (two) times daily.     . fluticasone (FLONASE) 50 MCG/ACT nasal spray Place 1 spray into both nostrils daily.    Marland Kitchen GRAPE SEED CR PO Take 1 tablet by mouth daily.     . InFLIXimab (REMICADE IV) Inject into the vein. Administered in office, as directed by Dr. Trudie Reed.    Marland Kitchen levothyroxine (SYNTHROID, LEVOTHROID) 50 MCG tablet Take 50 mcg by mouth daily before breakfast.    . Omega-3 Fatty Acids (FISH OIL) 1000 MG CAPS Take 2,000 capsules by mouth 2 (two) times daily.     Marland Kitchen omeprazole (PRILOSEC) 40 MG capsule Take 1 capsule (40 mg total) by mouth 2 (two) times daily. 60 capsule 0  . potassium gluconate 595 MG TABS tablet Take 595 mg by mouth every other day.     Marland Kitchen QUEtiapine Fumarate (SEROQUEL PO) Take 75 mg by mouth daily.    . vitamin C (ASCORBIC ACID) 500 MG tablet Take 500 mg by mouth daily.    . vitamin E 400 UNIT capsule Take 400 Units by mouth daily.     No current  facility-administered medications for this visit.      Past Medical History:  Diagnosis Date  . Asthma   . High cholesterol   . Rheumatoid arthritis ([place])   . Stroke ([place])   . Thyroid condition     ROS:   All systems reviewed and negative except as noted in the HPI.   Past Surgical History:  Procedure Laterality Date  . CESAREAN SECTION    . EP IMPLANTABLE DEVICE N/A 09/25/2014   Procedure: Loop Recorder Insertion;  Surgeon: Evans Lance, MD;  Location: [facility] INVASIVE CV LAB CUPID;  Service: Cardiovascular;  Laterality: N/A;  . FOOT SURGERY     x2  . TEE WITHOUT CARDIOVERSION N/A 09/25/2014   Procedure: TRANSESOPHAGEAL ECHOCARDIOGRAM (TEE);  Surgeon: Fay Records, MD;  Location: [facility] ENDOSCOPY;  Service: Cardiovascular;  Laterality: N/A;     Family History  Problem Relation Age of Onset  . Asthma Father      Social History   Social History  . Marital status: Married    Spouse name: N/A  . Number of children: 3  . Years of education: N/A   Occupational History  . retired  Social History Main Topics  . Smoking status: Never Smoker  . Smokeless tobacco: Never Used  . Alcohol use No  . Drug use: No  . Sexual activity: No   Other Topics Concern  . Not on file   Social History Narrative  . No narrative on file     BP 126/84   Pulse 78   Ht 5\' 3"  (1.6 m)   Wt 127 lb 3.2 oz (57.7 kg)   BMI 22.53 kg/m   Physical Exam:  Well appearing 66 yo woman, NAD HEENT: Unremarkable Neck:  7 cm JVD, no thyromegally Lymphatics:  No adenopathy Back:  No CVA tenderness Lungs:  Clear with no wheezes HEART:  Regular rate rhythm, no murmurs, no rubs, no clicks Abd:  soft, positive bowel sounds, no organomegally, no rebound, no guarding Ext:  2 plus pulses, no edema, no cyanosis, no clubbing Skin:  No rashes no nodules Neuro:  CN II through XII intact, motor grossly intact  ILR interogation - no arrhythmias or pauses   Assess/Plan: 1. Cryptogenic stroke - She will  continue her monitoring. Nothing so far to explain her stroke. 2. Syncope -she has not had an episode in several years. Will follow. No pauses or bradycardia. 3. Dyslipidemia - she will continue her statin therapy.

## 2016-11-21 NOTE — Patient Instructions (Signed)

## 2016-11-22 LAB — CUP PACEART INCLINIC DEVICE CHECK
Date Time Interrogation Session: 20180702132805
Implantable Pulse Generator Implant Date: 20160505

## 2016-12-01 DIAGNOSIS — F28 Other psychotic disorder not due to a substance or known physiological condition: Secondary | ICD-10-CM | POA: Diagnosis not present

## 2016-12-02 DIAGNOSIS — D2261 Melanocytic nevi of right upper limb, including shoulder: Secondary | ICD-10-CM | POA: Diagnosis not present

## 2016-12-02 DIAGNOSIS — L814 Other melanin hyperpigmentation: Secondary | ICD-10-CM | POA: Diagnosis not present

## 2016-12-02 DIAGNOSIS — L821 Other seborrheic keratosis: Secondary | ICD-10-CM | POA: Diagnosis not present

## 2016-12-02 DIAGNOSIS — L720 Epidermal cyst: Secondary | ICD-10-CM | POA: Diagnosis not present

## 2016-12-02 DIAGNOSIS — L738 Other specified follicular disorders: Secondary | ICD-10-CM | POA: Diagnosis not present

## 2016-12-02 DIAGNOSIS — L570 Actinic keratosis: Secondary | ICD-10-CM | POA: Diagnosis not present

## 2016-12-13 ENCOUNTER — Ambulatory Visit (INDEPENDENT_AMBULATORY_CARE_PROVIDER_SITE_OTHER): Payer: Medicare Other | Admitting: *Deleted

## 2016-12-13 DIAGNOSIS — I639 Cerebral infarction, unspecified: Secondary | ICD-10-CM

## 2016-12-14 NOTE — Progress Notes (Signed)
Carelink Summary Report / Loop Recorder 

## 2016-12-27 DIAGNOSIS — M150 Primary generalized (osteo)arthritis: Secondary | ICD-10-CM | POA: Diagnosis not present

## 2016-12-27 DIAGNOSIS — M2550 Pain in unspecified joint: Secondary | ICD-10-CM | POA: Diagnosis not present

## 2016-12-27 DIAGNOSIS — K210 Gastro-esophageal reflux disease with esophagitis: Secondary | ICD-10-CM | POA: Diagnosis not present

## 2016-12-27 DIAGNOSIS — M0579 Rheumatoid arthritis with rheumatoid factor of multiple sites without organ or systems involvement: Secondary | ICD-10-CM | POA: Diagnosis not present

## 2016-12-27 DIAGNOSIS — Z79899 Other long term (current) drug therapy: Secondary | ICD-10-CM | POA: Diagnosis not present

## 2016-12-27 DIAGNOSIS — Z6822 Body mass index (BMI) 22.0-22.9, adult: Secondary | ICD-10-CM | POA: Diagnosis not present

## 2016-12-29 LAB — CUP PACEART REMOTE DEVICE CHECK
Date Time Interrogation Session: 20180724220929
MDC IDC PG IMPLANT DT: 20160505

## 2017-01-06 DIAGNOSIS — Z1231 Encounter for screening mammogram for malignant neoplasm of breast: Secondary | ICD-10-CM | POA: Diagnosis not present

## 2017-01-06 DIAGNOSIS — M859 Disorder of bone density and structure, unspecified: Secondary | ICD-10-CM | POA: Diagnosis not present

## 2017-01-12 ENCOUNTER — Ambulatory Visit (INDEPENDENT_AMBULATORY_CARE_PROVIDER_SITE_OTHER): Payer: Medicare Other | Admitting: *Deleted

## 2017-01-12 DIAGNOSIS — I639 Cerebral infarction, unspecified: Secondary | ICD-10-CM

## 2017-01-12 DIAGNOSIS — M0579 Rheumatoid arthritis with rheumatoid factor of multiple sites without organ or systems involvement: Secondary | ICD-10-CM | POA: Diagnosis not present

## 2017-01-13 NOTE — Progress Notes (Signed)
Carelink Summary Report / Loop Recorder 

## 2017-01-17 LAB — CUP PACEART REMOTE DEVICE CHECK
Date Time Interrogation Session: 20180823224015
MDC IDC PG IMPLANT DT: 20160505

## 2017-01-17 NOTE — Progress Notes (Signed)
Carelink summary report received. Battery status OK. Normal device function. No new symptom episodes, tachy episodes, brady, or pause episodes. No new AF episodes. Monthly summary reports and ROV/PRN 

## 2017-02-07 DIAGNOSIS — Z23 Encounter for immunization: Secondary | ICD-10-CM | POA: Diagnosis not present

## 2017-02-07 DIAGNOSIS — L659 Nonscarring hair loss, unspecified: Secondary | ICD-10-CM | POA: Diagnosis not present

## 2017-02-07 DIAGNOSIS — Z79899 Other long term (current) drug therapy: Secondary | ICD-10-CM | POA: Diagnosis not present

## 2017-02-07 DIAGNOSIS — E039 Hypothyroidism, unspecified: Secondary | ICD-10-CM | POA: Diagnosis not present

## 2017-02-07 DIAGNOSIS — E559 Vitamin D deficiency, unspecified: Secondary | ICD-10-CM | POA: Diagnosis not present

## 2017-02-09 DIAGNOSIS — M0579 Rheumatoid arthritis with rheumatoid factor of multiple sites without organ or systems involvement: Secondary | ICD-10-CM | POA: Diagnosis not present

## 2017-02-13 ENCOUNTER — Ambulatory Visit (INDEPENDENT_AMBULATORY_CARE_PROVIDER_SITE_OTHER): Payer: Medicare Other | Admitting: *Deleted

## 2017-02-13 DIAGNOSIS — I639 Cerebral infarction, unspecified: Secondary | ICD-10-CM

## 2017-02-13 NOTE — Progress Notes (Signed)
Carelink Summary Report / Loop Recorder 

## 2017-02-14 LAB — CUP PACEART REMOTE DEVICE CHECK
Implantable Pulse Generator Implant Date: 20160505
MDC IDC SESS DTM: 20180922224641

## 2017-02-17 DIAGNOSIS — L603 Nail dystrophy: Secondary | ICD-10-CM | POA: Diagnosis not present

## 2017-02-17 DIAGNOSIS — L650 Telogen effluvium: Secondary | ICD-10-CM | POA: Diagnosis not present

## 2017-02-22 DIAGNOSIS — L659 Nonscarring hair loss, unspecified: Secondary | ICD-10-CM | POA: Diagnosis not present

## 2017-03-09 DIAGNOSIS — F28 Other psychotic disorder not due to a substance or known physiological condition: Secondary | ICD-10-CM | POA: Diagnosis not present

## 2017-03-13 ENCOUNTER — Ambulatory Visit (INDEPENDENT_AMBULATORY_CARE_PROVIDER_SITE_OTHER): Payer: Medicare Other | Admitting: *Deleted

## 2017-03-13 DIAGNOSIS — I639 Cerebral infarction, unspecified: Secondary | ICD-10-CM | POA: Diagnosis not present

## 2017-03-15 LAB — CUP PACEART REMOTE DEVICE CHECK
Date Time Interrogation Session: 20181022233853
MDC IDC PG IMPLANT DT: 20160505

## 2017-03-15 NOTE — Progress Notes (Signed)
Carelink Summary Report / Loop Recorder 

## 2017-03-27 DIAGNOSIS — R05 Cough: Secondary | ICD-10-CM | POA: Diagnosis not present

## 2017-03-27 DIAGNOSIS — M0579 Rheumatoid arthritis with rheumatoid factor of multiple sites without organ or systems involvement: Secondary | ICD-10-CM | POA: Diagnosis not present

## 2017-03-27 DIAGNOSIS — Z6822 Body mass index (BMI) 22.0-22.9, adult: Secondary | ICD-10-CM | POA: Diagnosis not present

## 2017-03-27 DIAGNOSIS — M2550 Pain in unspecified joint: Secondary | ICD-10-CM | POA: Diagnosis not present

## 2017-03-27 DIAGNOSIS — M150 Primary generalized (osteo)arthritis: Secondary | ICD-10-CM | POA: Diagnosis not present

## 2017-03-27 DIAGNOSIS — K210 Gastro-esophageal reflux disease with esophagitis: Secondary | ICD-10-CM | POA: Diagnosis not present

## 2017-04-03 ENCOUNTER — Other Ambulatory Visit: Payer: Self-pay | Admitting: Family Medicine

## 2017-04-03 DIAGNOSIS — K824 Cholesterolosis of gallbladder: Secondary | ICD-10-CM | POA: Diagnosis not present

## 2017-04-03 DIAGNOSIS — R109 Unspecified abdominal pain: Secondary | ICD-10-CM

## 2017-04-03 DIAGNOSIS — R1013 Epigastric pain: Secondary | ICD-10-CM | POA: Diagnosis not present

## 2017-04-06 DIAGNOSIS — M0579 Rheumatoid arthritis with rheumatoid factor of multiple sites without organ or systems involvement: Secondary | ICD-10-CM | POA: Diagnosis not present

## 2017-04-06 DIAGNOSIS — Z79899 Other long term (current) drug therapy: Secondary | ICD-10-CM | POA: Diagnosis not present

## 2017-04-10 ENCOUNTER — Ambulatory Visit
Admission: RE | Admit: 2017-04-10 | Discharge: 2017-04-10 | Disposition: A | Discharge status: Home / Self Care | Payer: Medicare Other | Source: Ambulatory Visit | Attending: Family Medicine | Admitting: Family Medicine

## 2017-04-10 DIAGNOSIS — K824 Cholesterolosis of gallbladder: Secondary | ICD-10-CM | POA: Diagnosis not present

## 2017-04-10 DIAGNOSIS — R109 Unspecified abdominal pain: Secondary | ICD-10-CM

## 2017-04-12 ENCOUNTER — Ambulatory Visit (INDEPENDENT_AMBULATORY_CARE_PROVIDER_SITE_OTHER): Payer: Medicare Other | Admitting: *Deleted

## 2017-04-12 DIAGNOSIS — I639 Cerebral infarction, unspecified: Secondary | ICD-10-CM

## 2017-04-17 NOTE — Progress Notes (Signed)
Carelink Summary Report / Loop Recorder 

## 2017-04-20 DIAGNOSIS — M0579 Rheumatoid arthritis with rheumatoid factor of multiple sites without organ or systems involvement: Secondary | ICD-10-CM | POA: Diagnosis not present

## 2017-04-26 LAB — CUP PACEART REMOTE DEVICE CHECK
Date Time Interrogation Session: 20181121233951
MDC IDC PG IMPLANT DT: 20160505

## 2017-05-04 DIAGNOSIS — M0579 Rheumatoid arthritis with rheumatoid factor of multiple sites without organ or systems involvement: Secondary | ICD-10-CM | POA: Diagnosis not present

## 2017-05-12 ENCOUNTER — Ambulatory Visit (INDEPENDENT_AMBULATORY_CARE_PROVIDER_SITE_OTHER): Payer: Medicare Other | Admitting: *Deleted

## 2017-05-12 DIAGNOSIS — I639 Cerebral infarction, unspecified: Secondary | ICD-10-CM

## 2017-05-17 NOTE — Progress Notes (Signed)
Carelink Summary Report / Loop Recorder 

## 2017-05-25 LAB — CUP PACEART REMOTE DEVICE CHECK
Implantable Pulse Generator Implant Date: 20160505
MDC IDC SESS DTM: 20181221233846

## 2017-06-02 DIAGNOSIS — M0579 Rheumatoid arthritis with rheumatoid factor of multiple sites without organ or systems involvement: Secondary | ICD-10-CM | POA: Diagnosis not present

## 2017-06-07 DIAGNOSIS — R05 Cough: Secondary | ICD-10-CM | POA: Diagnosis not present

## 2017-06-07 DIAGNOSIS — J069 Acute upper respiratory infection, unspecified: Secondary | ICD-10-CM | POA: Diagnosis not present

## 2017-06-12 ENCOUNTER — Ambulatory Visit (INDEPENDENT_AMBULATORY_CARE_PROVIDER_SITE_OTHER): Payer: Medicare Other | Admitting: *Deleted

## 2017-06-12 DIAGNOSIS — I639 Cerebral infarction, unspecified: Secondary | ICD-10-CM

## 2017-06-12 NOTE — Progress Notes (Signed)
Carelink Summary Report / Loop Recorder 

## 2017-06-14 ENCOUNTER — Telehealth: Payer: Self-pay | Admitting: Cardiology

## 2017-06-14 NOTE — Telephone Encounter (Signed)
LMOVM requesting that pt send manual transmission b/c home monitor has not updated in at least 14 days.    

## 2017-06-18 DIAGNOSIS — J069 Acute upper respiratory infection, unspecified: Secondary | ICD-10-CM | POA: Diagnosis not present

## 2017-06-18 DIAGNOSIS — J209 Acute bronchitis, unspecified: Secondary | ICD-10-CM | POA: Diagnosis not present

## 2017-06-19 DIAGNOSIS — F28 Other psychotic disorder not due to a substance or known physiological condition: Secondary | ICD-10-CM | POA: Diagnosis not present

## 2017-06-19 LAB — CUP PACEART REMOTE DEVICE CHECK
Date Time Interrogation Session: 20190121003849
Implantable Pulse Generator Implant Date: 20160505

## 2017-06-29 DIAGNOSIS — Z79899 Other long term (current) drug therapy: Secondary | ICD-10-CM | POA: Diagnosis not present

## 2017-06-29 DIAGNOSIS — M0579 Rheumatoid arthritis with rheumatoid factor of multiple sites without organ or systems involvement: Secondary | ICD-10-CM | POA: Diagnosis not present

## 2017-06-29 DIAGNOSIS — M150 Primary generalized (osteo)arthritis: Secondary | ICD-10-CM | POA: Diagnosis not present

## 2017-06-29 DIAGNOSIS — Z6822 Body mass index (BMI) 22.0-22.9, adult: Secondary | ICD-10-CM | POA: Diagnosis not present

## 2017-06-29 DIAGNOSIS — M2550 Pain in unspecified joint: Secondary | ICD-10-CM | POA: Diagnosis not present

## 2017-07-07 DIAGNOSIS — H8111 Benign paroxysmal vertigo, right ear: Secondary | ICD-10-CM | POA: Diagnosis not present

## 2017-07-11 ENCOUNTER — Ambulatory Visit (INDEPENDENT_AMBULATORY_CARE_PROVIDER_SITE_OTHER): Payer: Medicare Other | Admitting: *Deleted

## 2017-07-11 DIAGNOSIS — I639 Cerebral infarction, unspecified: Secondary | ICD-10-CM | POA: Diagnosis not present

## 2017-07-13 DIAGNOSIS — M0579 Rheumatoid arthritis with rheumatoid factor of multiple sites without organ or systems involvement: Secondary | ICD-10-CM | POA: Diagnosis not present

## 2017-07-13 NOTE — Progress Notes (Signed)
Carelink Summary Report / Loop Recorder 

## 2017-07-28 DIAGNOSIS — D225 Melanocytic nevi of trunk: Secondary | ICD-10-CM | POA: Diagnosis not present

## 2017-07-28 DIAGNOSIS — L821 Other seborrheic keratosis: Secondary | ICD-10-CM | POA: Diagnosis not present

## 2017-07-28 DIAGNOSIS — D2262 Melanocytic nevi of left upper limb, including shoulder: Secondary | ICD-10-CM | POA: Diagnosis not present

## 2017-07-28 DIAGNOSIS — D2271 Melanocytic nevi of right lower limb, including hip: Secondary | ICD-10-CM | POA: Diagnosis not present

## 2017-07-28 DIAGNOSIS — L603 Nail dystrophy: Secondary | ICD-10-CM | POA: Diagnosis not present

## 2017-07-28 DIAGNOSIS — L218 Other seborrheic dermatitis: Secondary | ICD-10-CM | POA: Diagnosis not present

## 2017-07-28 DIAGNOSIS — D1801 Hemangioma of skin and subcutaneous tissue: Secondary | ICD-10-CM | POA: Diagnosis not present

## 2017-07-28 DIAGNOSIS — L650 Telogen effluvium: Secondary | ICD-10-CM | POA: Diagnosis not present

## 2017-08-01 DIAGNOSIS — E782 Mixed hyperlipidemia: Secondary | ICD-10-CM | POA: Diagnosis not present

## 2017-08-01 DIAGNOSIS — M8589 Other specified disorders of bone density and structure, multiple sites: Secondary | ICD-10-CM | POA: Diagnosis not present

## 2017-08-01 DIAGNOSIS — Z0000 Encounter for general adult medical examination without abnormal findings: Secondary | ICD-10-CM | POA: Diagnosis not present

## 2017-08-01 DIAGNOSIS — K219 Gastro-esophageal reflux disease without esophagitis: Secondary | ICD-10-CM | POA: Diagnosis not present

## 2017-08-01 DIAGNOSIS — J4520 Mild intermittent asthma, uncomplicated: Secondary | ICD-10-CM | POA: Diagnosis not present

## 2017-08-01 DIAGNOSIS — E039 Hypothyroidism, unspecified: Secondary | ICD-10-CM | POA: Diagnosis not present

## 2017-08-04 DIAGNOSIS — M0570 Rheumatoid arthritis with rheumatoid factor of unspecified site without organ or systems involvement: Secondary | ICD-10-CM | POA: Diagnosis not present

## 2017-08-04 DIAGNOSIS — H524 Presbyopia: Secondary | ICD-10-CM | POA: Diagnosis not present

## 2017-08-04 DIAGNOSIS — H52223 Regular astigmatism, bilateral: Secondary | ICD-10-CM | POA: Diagnosis not present

## 2017-08-04 DIAGNOSIS — H2513 Age-related nuclear cataract, bilateral: Secondary | ICD-10-CM | POA: Diagnosis not present

## 2017-08-04 DIAGNOSIS — H04123 Dry eye syndrome of bilateral lacrimal glands: Secondary | ICD-10-CM | POA: Diagnosis not present

## 2017-08-04 DIAGNOSIS — M3500 Sicca syndrome, unspecified: Secondary | ICD-10-CM | POA: Diagnosis not present

## 2017-08-04 DIAGNOSIS — H5203 Hypermetropia, bilateral: Secondary | ICD-10-CM | POA: Diagnosis not present

## 2017-08-10 DIAGNOSIS — M0579 Rheumatoid arthritis with rheumatoid factor of multiple sites without organ or systems involvement: Secondary | ICD-10-CM | POA: Diagnosis not present

## 2017-08-14 ENCOUNTER — Ambulatory Visit (INDEPENDENT_AMBULATORY_CARE_PROVIDER_SITE_OTHER): Payer: Medicare Other | Admitting: *Deleted

## 2017-08-14 DIAGNOSIS — I639 Cerebral infarction, unspecified: Secondary | ICD-10-CM | POA: Diagnosis not present

## 2017-08-14 LAB — CUP PACEART REMOTE DEVICE CHECK
Date Time Interrogation Session: 20190221010948
Implantable Pulse Generator Implant Date: 20160505

## 2017-08-15 NOTE — Progress Notes (Signed)
Carelink Summary Report / Loop Recorder 

## 2017-09-07 DIAGNOSIS — M0579 Rheumatoid arthritis with rheumatoid factor of multiple sites without organ or systems involvement: Secondary | ICD-10-CM | POA: Diagnosis not present

## 2017-09-14 ENCOUNTER — Other Ambulatory Visit: Payer: Self-pay | Admitting: Family Medicine

## 2017-09-14 ENCOUNTER — Ambulatory Visit
Admission: RE | Admit: 2017-09-14 | Discharge: 2017-09-14 | Disposition: A | Discharge status: Home / Self Care | Payer: Medicare Other | Source: Ambulatory Visit | Attending: Family Medicine | Admitting: Family Medicine

## 2017-09-14 DIAGNOSIS — K219 Gastro-esophageal reflux disease without esophagitis: Secondary | ICD-10-CM | POA: Diagnosis not present

## 2017-09-14 DIAGNOSIS — R053 Chronic cough: Secondary | ICD-10-CM

## 2017-09-14 DIAGNOSIS — R05 Cough: Secondary | ICD-10-CM | POA: Diagnosis not present

## 2017-09-18 ENCOUNTER — Ambulatory Visit (INDEPENDENT_AMBULATORY_CARE_PROVIDER_SITE_OTHER): Payer: Medicare Other | Admitting: *Deleted

## 2017-09-18 DIAGNOSIS — I639 Cerebral infarction, unspecified: Secondary | ICD-10-CM

## 2017-09-18 DIAGNOSIS — K219 Gastro-esophageal reflux disease without esophagitis: Secondary | ICD-10-CM | POA: Diagnosis not present

## 2017-09-18 DIAGNOSIS — H6122 Impacted cerumen, left ear: Secondary | ICD-10-CM | POA: Diagnosis not present

## 2017-09-18 NOTE — Progress Notes (Signed)
Carelink Summary Report / Loop Recorder 

## 2017-09-22 LAB — CUP PACEART REMOTE DEVICE CHECK
Date Time Interrogation Session: 20190326014057
MDC IDC PG IMPLANT DT: 20160505

## 2017-09-26 DIAGNOSIS — Z79899 Other long term (current) drug therapy: Secondary | ICD-10-CM | POA: Diagnosis not present

## 2017-09-26 DIAGNOSIS — M2550 Pain in unspecified joint: Secondary | ICD-10-CM | POA: Diagnosis not present

## 2017-09-26 DIAGNOSIS — M0579 Rheumatoid arthritis with rheumatoid factor of multiple sites without organ or systems involvement: Secondary | ICD-10-CM | POA: Diagnosis not present

## 2017-09-26 DIAGNOSIS — K210 Gastro-esophageal reflux disease with esophagitis: Secondary | ICD-10-CM | POA: Diagnosis not present

## 2017-09-26 DIAGNOSIS — M150 Primary generalized (osteo)arthritis: Secondary | ICD-10-CM | POA: Diagnosis not present

## 2017-09-26 DIAGNOSIS — Z6821 Body mass index (BMI) 21.0-21.9, adult: Secondary | ICD-10-CM | POA: Diagnosis not present

## 2017-10-05 DIAGNOSIS — Z79899 Other long term (current) drug therapy: Secondary | ICD-10-CM | POA: Diagnosis not present

## 2017-10-05 DIAGNOSIS — M0579 Rheumatoid arthritis with rheumatoid factor of multiple sites without organ or systems involvement: Secondary | ICD-10-CM | POA: Diagnosis not present

## 2017-10-09 DIAGNOSIS — F28 Other psychotic disorder not due to a substance or known physiological condition: Secondary | ICD-10-CM | POA: Diagnosis not present

## 2017-10-11 LAB — CUP PACEART REMOTE DEVICE CHECK
Date Time Interrogation Session: 20190428020841
MDC IDC PG IMPLANT DT: 20160505

## 2017-10-13 DIAGNOSIS — K219 Gastro-esophageal reflux disease without esophagitis: Secondary | ICD-10-CM | POA: Diagnosis not present

## 2017-10-19 ENCOUNTER — Ambulatory Visit (INDEPENDENT_AMBULATORY_CARE_PROVIDER_SITE_OTHER): Payer: Medicare Other | Admitting: *Deleted

## 2017-10-19 DIAGNOSIS — I639 Cerebral infarction, unspecified: Secondary | ICD-10-CM | POA: Diagnosis not present

## 2017-10-20 NOTE — Progress Notes (Signed)
Carelink Summary Report / Loop Recorder 

## 2017-11-02 DIAGNOSIS — M0579 Rheumatoid arthritis with rheumatoid factor of multiple sites without organ or systems involvement: Secondary | ICD-10-CM | POA: Diagnosis not present

## 2017-11-03 ENCOUNTER — Telehealth: Payer: Self-pay | Admitting: *Deleted

## 2017-11-03 NOTE — Telephone Encounter (Signed)
LVMOM regarding LINQ at [facility] since 12/29/17. Gave Device number to return call to discuss next steps.

## 2017-11-03 NOTE — Telephone Encounter (Signed)
Spoke with patient regarding RRT. Discussed with patient the next steps. Patient verbalized discussing LINQ explant at [place] 01/05/18 with Dr. Lovena Le. Advised patient would be sending a return kit for [employer] monitor. Confirmed address. Patient verbalized understanding.

## 2017-11-13 LAB — CUP PACEART REMOTE DEVICE CHECK
Implantable Pulse Generator Implant Date: 20160505
MDC IDC SESS DTM: 20190531023522

## 2017-11-30 DIAGNOSIS — M0579 Rheumatoid arthritis with rheumatoid factor of multiple sites without organ or systems involvement: Secondary | ICD-10-CM | POA: Diagnosis not present

## 2017-12-28 DIAGNOSIS — K219 Gastro-esophageal reflux disease without esophagitis: Secondary | ICD-10-CM | POA: Diagnosis not present

## 2017-12-28 DIAGNOSIS — M0579 Rheumatoid arthritis with rheumatoid factor of multiple sites without organ or systems involvement: Secondary | ICD-10-CM | POA: Diagnosis not present

## 2017-12-28 DIAGNOSIS — R1013 Epigastric pain: Secondary | ICD-10-CM | POA: Diagnosis not present

## 2017-12-31 ENCOUNTER — Other Ambulatory Visit: Payer: Self-pay

## 2017-12-31 ENCOUNTER — Emergency Department (HOSPITAL_COMMUNITY)
Admission: EM | Admit: 2017-12-31 | Discharge: 2017-12-31 | Disposition: A | Discharge status: Home / Self Care | Payer: Medicare Other | Attending: Emergency Medicine | Admitting: Emergency Medicine

## 2017-12-31 ENCOUNTER — Emergency Department (HOSPITAL_COMMUNITY): Payer: Medicare Other

## 2017-12-31 DIAGNOSIS — R1013 Epigastric pain: Secondary | ICD-10-CM

## 2017-12-31 DIAGNOSIS — J45909 Unspecified asthma, uncomplicated: Secondary | ICD-10-CM | POA: Insufficient documentation

## 2017-12-31 DIAGNOSIS — Z79899 Other long term (current) drug therapy: Secondary | ICD-10-CM | POA: Insufficient documentation

## 2017-12-31 DIAGNOSIS — R109 Unspecified abdominal pain: Secondary | ICD-10-CM | POA: Diagnosis present

## 2017-12-31 DIAGNOSIS — K760 Fatty (change of) liver, not elsewhere classified: Secondary | ICD-10-CM | POA: Diagnosis not present

## 2017-12-31 DIAGNOSIS — E039 Hypothyroidism, unspecified: Secondary | ICD-10-CM | POA: Insufficient documentation

## 2017-12-31 LAB — COMPREHENSIVE METABOLIC PANEL
ALK PHOS: 75 U/L (ref 38–126)
ALT: 23 U/L (ref 0–44)
AST: 33 U/L (ref 15–41)
Albumin: 4.3 g/dL (ref 3.5–5.0)
Anion gap: 9 (ref 5–15)
BILIRUBIN TOTAL: 0.4 mg/dL (ref 0.3–1.2)
BUN: 17 mg/dL (ref 8–23)
CO2: 26 mmol/L (ref 22–32)
Calcium: 9.9 mg/dL (ref 8.9–10.3)
Chloride: 107 mmol/L (ref 98–111)
Creatinine, Ser: 0.79 mg/dL (ref 0.44–1.00)
Glucose, Bld: 110 mg/dL — ABNORMAL HIGH (ref 70–99)
Potassium: 3.4 mmol/L — ABNORMAL LOW (ref 3.5–5.1)
Sodium: 142 mmol/L (ref 135–145)
Total Protein: 7.9 g/dL (ref 6.5–8.1)

## 2017-12-31 LAB — URINALYSIS, ROUTINE W REFLEX MICROSCOPIC
BACTERIA UA: NONE SEEN
Bilirubin Urine: NEGATIVE
Glucose, UA: NEGATIVE mg/dL
HGB URINE DIPSTICK: NEGATIVE
KETONES UR: NEGATIVE mg/dL
Nitrite: NEGATIVE
PROTEIN: NEGATIVE mg/dL
Specific Gravity, Urine: 1.008 (ref 1.005–1.030)
pH: 6 (ref 5.0–8.0)

## 2017-12-31 LAB — CBC
HEMATOCRIT: 40.9 % (ref 36.0–46.0)
HEMOGLOBIN: 13.3 g/dL (ref 12.0–15.0)
MCH: 29.3 pg (ref 26.0–34.0)
MCHC: 32.5 g/dL (ref 30.0–36.0)
MCV: 90.1 fL (ref 78.0–100.0)
Platelets: 343 10*3/uL (ref 150–400)
RBC: 4.54 MIL/uL (ref 3.87–5.11)
RDW: 13.6 % (ref 11.5–15.5)
WBC: 8.3 10*3/uL (ref 4.0–10.5)

## 2017-12-31 LAB — LIPASE, BLOOD: LIPASE: 54 U/L — AB (ref 11–51)

## 2017-12-31 MED ORDER — IOPAMIDOL (ISOVUE-300) INJECTION 61%
INTRAVENOUS | Status: AC
  Administered 2017-12-31: 100 mL
  Filled 2017-12-31: qty 100

## 2017-12-31 MED ORDER — SUCRALFATE 1 G PO TABS: 1.0000 g | ORAL_TABLET | Freq: Three times a day (TID) | ORAL | 0 refills | Status: DC

## 2017-12-31 MED ORDER — RANITIDINE HCL 150 MG PO TABS: 150.0000 mg | ORAL_TABLET | Freq: Two times a day (BID) | ORAL | 0 refills | Status: DC

## 2017-12-31 MED ORDER — GI COCKTAIL ~~LOC~~
30.0000 mL | Freq: Once | ORAL | Status: AC
  Administered 2017-12-31: 30 mL via ORAL
  Filled 2017-12-31: qty 30

## 2017-12-31 NOTE — ED Provider Notes (Signed)
Medical screening examination/treatment/procedure(s) were conducted as a shared visit with non-physician practitioner(s) and myself.  I personally evaluated the patient during the encounter.  67 year old female here with abdominal distention.  Patient states that she had sharp pains in epigastric area a couple days ago the last 78 hours are unlike any sharp pain she did arrive before then it suddenly disappeared.  Ever since that she is been intermittently distended with bloating is worse after eating. Exam without peritonitis but does have some distention of her abdomen.  Her vital signs are within normal limits.  Concern for possible pancreatitis versus gallbladder disease versus peripheral ulcer.  Could also just be reflux.  CT scan will be done and disposition appropriately.    Lee Kuang, Corene Cornea, MD 01/01/18 [phone]

## 2017-12-31 NOTE — ED Triage Notes (Signed)
Pt to ED by POV with complaints of pain that started on Thursday on her way to the RA doctors office for infusion. After the patient left she went home and ate which caused her to be in excruciating pain. Patient went to [facility] walk in clinic and had x-rays done. The clinic suspected Pancreatitis from the symptoms and referred here for tests. Pt states her pain now is 2/10, but is very uncomfortable with bloating "feels like she is 3-4 months pregnant" Pt has visually distention throughout all 4 quadrants. Pt states she is able to keep food down, but after about 2 hours the pain increases. Pt has hx of RA, GERD, and TIA in may 2016.

## 2017-12-31 NOTE — Discharge Instructions (Addendum)
Call Dr. Collene Mares tomorrow to schedule a time to be seen for further evaluation of severe epigastric pain. Take medications as prescribed and return to the emergency department with any new or worsening symptoms.

## 2017-12-31 NOTE — ED Provider Notes (Signed)
[facility] DEPT Provider Note   CSN: [id number] Arrival date & time: 12/31/17  1455     History   Chief Complaint Chief Complaint  Patient presents with  . Abdominal Pain  . Pancreatitis    HPI Shannon Collins is a 67 y.o. female.  Patient presents with sharp pain in the epigastrium that started suddenly 4 days ago. It has been coming and going since. She feels it is worse after eating and when lying down. The pain spreads to the RUQ and LUQ but does not radiate to chest or back. She feels bloated but denies increased belching or flatus more than her usual with history of reflex. She reports normal bowel movements without constipation or melena. No nausea or vomiting. No fever. She takes Protonix twice daily for reflux and is usually well controlled. She had an EGD in 2017 without evidence of ulcer disease. She has tried taking TUMs in addition to her Protonix without relief.   The history is provided by the patient and the spouse. No language interpreter was used.  Abdominal Pain   Pertinent negatives include fever, diarrhea, nausea, vomiting and constipation.    Past Medical History:  Diagnosis Date  . Acute CVA (cerebrovascular accident) ([place]) 09/24/2014  . Asthma   . Bipolar disorder ([place]) 09/24/2014  . Cerebral embolism with cerebral infarction ([place])   . Chronic cough 07/08/2014   Spirometry 06/2014: normal CXR 06/2014: clear   . High cholesterol   . Hyperlipidemia 09/24/2014  . Hypothyroidism 09/24/2014  . Rheumatoid arthritis ([place])   . Right arm weakness   . Stroke ([place])   . Stroke with cerebral ischemia ([place])   . Thyroid condition   . TIA (transient ischemic attack) 09/24/2014    Patient Active Problem List   Diagnosis Date Noted  . TIA (transient ischemic attack) 09/24/2014  . Hypothyroidism 09/24/2014  . Hyperlipidemia 09/24/2014  . Bipolar disorder ([place]) 09/24/2014  . Acute CVA (cerebrovascular accident) ([place]) 09/24/2014  . Right arm  weakness   . Stroke with cerebral ischemia ([place])   . Cerebral embolism with cerebral infarction ([place])   . Chronic cough 07/08/2014    Past Surgical History:  Procedure Laterality Date  . CESAREAN SECTION    . EP IMPLANTABLE DEVICE N/A 09/25/2014   Procedure: Loop Recorder Insertion;  Surgeon: Evans Lance, MD;  Location: [facility] INVASIVE CV LAB CUPID;  Service: Cardiovascular;  Laterality: N/A;  . FOOT SURGERY     x2  . TEE WITHOUT CARDIOVERSION N/A 09/25/2014   Procedure: TRANSESOPHAGEAL ECHOCARDIOGRAM (TEE);  Surgeon: Fay Records, MD;  Location: [facility] ENDOSCOPY;  Service: Cardiovascular;  Laterality: N/A;     OB History   None      Home Medications    Prior to Admission medications   Medication Sig Start Date End Date Taking? Authorizing Provider  atorvastatin (LIPITOR) 10 MG tablet Take 10 mg by mouth every other day.    [provider]  beta carotene w/minerals (OCUVITE) tablet Take 1 tablet by mouth daily.    [provider]  calcium carbonate (OS-CAL) 600 MG TABS tablet Take 1,200 mg by mouth 2 (two) times daily.     [provider]  Cholecalciferol (VITAMIN D) 2000 UNITS CAPS Take 2,000 Units by mouth daily.     [provider]  clopidogrel (PLAVIX) 75 MG tablet Take 1 tablet (75 mg total) by mouth daily. 09/14/15   Dennie Bible, NP  cycloSPORINE (RESTASIS) 0.05 % ophthalmic emulsion  Place 1 drop into both eyes 2 (two) times daily.     [provider]  fluticasone (FLONASE) 50 MCG/ACT nasal spray Place 1 spray into both nostrils daily.    [provider]  GRAPE SEED CR PO Take 1 tablet by mouth daily.     [provider]  InFLIXimab (REMICADE IV) Inject into the vein. Administered in office, as directed by Dr. Trudie Reed.    [provider]  levothyroxine (SYNTHROID, LEVOTHROID) 50 MCG tablet Take 50 mcg by mouth daily before breakfast.    [provider]  Omega-3 Fatty Acids (FISH OIL) 1000 MG  CAPS Take 2,000 capsules by mouth 2 (two) times daily.     [provider]  omeprazole (PRILOSEC) 40 MG capsule Take 1 capsule (40 mg total) by mouth 2 (two) times daily. 07/29/14   Clance, Armando Reichert, MD  potassium gluconate 595 MG TABS tablet Take 595 mg by mouth every other day.     [provider]  QUEtiapine Fumarate (SEROQUEL PO) Take 75 mg by mouth daily.    [provider]  vitamin C (ASCORBIC ACID) 500 MG tablet Take 500 mg by mouth daily.    [provider]  vitamin E 400 UNIT capsule Take 400 Units by mouth daily.    [provider]    Family History Family History  Problem Relation Age of Onset  . Asthma Father     Social History Social History   Tobacco Use  . Smoking status: Never Smoker  . Smokeless tobacco: Never Used  Substance Use Topics  . Alcohol use: No    Alcohol/week: 0.0 standard drinks  . Drug use: No     Allergies   Azithromycin; Biaxin [clarithromycin]; Codeine; and Tramadol   Review of Systems Review of Systems  Constitutional: Negative for chills and fever.  HENT: Negative.   Respiratory: Negative.   Cardiovascular: Negative.   Gastrointestinal: Positive for abdominal distention and abdominal pain. Negative for constipation, diarrhea, nausea and vomiting.  Genitourinary: Negative.   Musculoskeletal: Negative.   Skin: Negative.   Neurological: Negative.      Physical Exam Updated Vital Signs BP 133/72 (BP Location: Left Arm)   Pulse 88   Temp 98.3 F (36.8 C) (Oral)   Resp 17   Ht 5\' 3"  (1.6 m)   Wt 54.4 kg   SpO2 99%   BMI 21.26 kg/m   Physical Exam  Constitutional: She appears well-developed and well-nourished.  HENT:  Head: Normocephalic.  Neck: Normal range of motion. Neck supple.  Cardiovascular: Normal rate and regular rhythm.  Pulmonary/Chest: Effort normal and breath sounds normal. She has no wheezes. She has no rhonchi. She has no rales.  Abdominal: Soft. Bowel sounds are  normal. There is no tenderness (No pain with palpation of upper abdomen.). There is no rebound and no guarding.  Musculoskeletal: Normal range of motion.  Neurological: She is alert. No cranial nerve deficit.  Skin: Skin is warm and dry. No rash noted.  Psychiatric: She has a normal mood and affect.     ED Treatments / Results  Labs (all labs ordered are listed, but only abnormal results are displayed) Labs Reviewed  LIPASE, BLOOD  COMPREHENSIVE METABOLIC PANEL  CBC  URINALYSIS, ROUTINE W REFLEX MICROSCOPIC    EKG None  Radiology No results found.  Procedures Procedures (including critical care time)  Medications Ordered in ED Medications  gi cocktail (Maalox,Lidocaine,Donnatal) (has no administration in time range)     Initial Impression /  Assessment and Plan / ED Course  I have reviewed the triage vital signs and the nursing notes.  Pertinent labs & imaging results that were available during my care of the patient were reviewed by me and considered in my medical decision making (see chart for details).     Patient presents with sharp, severe epigastric pain x 4 days, now affecting bilateral upper quadrants. No fever, vomiting. H/o reflux.   Patient given a GI cocktail and states it worked on the 1/10 pain she was having here. She does not feel it would be affective on the more severe pain.   Labs are remarkable for a mildly elevated lipase of 54. CT scan ordered for further evaluation and is negative for acute abdominal findings.   Recheck finds the patient comfortable without recurrent symptoms. VSS.   Will add Zantac and carafate to her regular bid protonix. Strongly encouraged follow up with Dr. Collene Mares for further outpatient evaluation.   Patient and husband are comfortable with discharge home.   Final Clinical Impressions(s) / ED Diagnoses   Final diagnoses:  None   1. Epigastric abdominal pain.  ED Discharge Orders    None       Dennie Bible 12/31/17 2025    Mesner, Corene Cornea, MD 01/01/18 [phone]

## 2018-01-02 ENCOUNTER — Other Ambulatory Visit: Payer: Self-pay | Admitting: Gastroenterology

## 2018-01-02 DIAGNOSIS — Z86010 Personal history of colonic polyps: Secondary | ICD-10-CM | POA: Diagnosis not present

## 2018-01-02 DIAGNOSIS — Z1211 Encounter for screening for malignant neoplasm of colon: Secondary | ICD-10-CM | POA: Diagnosis not present

## 2018-01-02 DIAGNOSIS — K7581 Nonalcoholic steatohepatitis (NASH): Secondary | ICD-10-CM | POA: Diagnosis not present

## 2018-01-02 DIAGNOSIS — R1033 Periumbilical pain: Secondary | ICD-10-CM | POA: Diagnosis not present

## 2018-01-02 DIAGNOSIS — R1011 Right upper quadrant pain: Secondary | ICD-10-CM

## 2018-01-05 ENCOUNTER — Ambulatory Visit (INDEPENDENT_AMBULATORY_CARE_PROVIDER_SITE_OTHER): Payer: Medicare Other | Admitting: Internal Medicine

## 2018-01-05 ENCOUNTER — Encounter: Payer: Self-pay | Admitting: Internal Medicine

## 2018-01-05 VITALS — BP 120/60 | HR 77 | Ht 63.0 in | Wt 120.0 lb

## 2018-01-05 DIAGNOSIS — R55 Syncope and collapse: Secondary | ICD-10-CM

## 2018-01-05 DIAGNOSIS — I639 Cerebral infarction, unspecified: Secondary | ICD-10-CM | POA: Diagnosis not present

## 2018-01-05 LAB — CUP PACEART INCLINIC DEVICE CHECK
Date Time Interrogation Session: 20190816101603
MDC IDC PG IMPLANT DT: 20160505

## 2018-01-05 MED ORDER — ASPIRIN EC 81 MG PO TBEC: 81.0000 mg | DELAYED_RELEASE_TABLET | Freq: Every day | ORAL | 3 refills | Status: DC

## 2018-01-05 NOTE — Patient Instructions (Addendum)
Medication Instructions:  Your physician recommends that you continue on your current medications as directed. Please refer to the Current Medication list given to you today.  Stop Plavix Start ASA 81 mg daily  Labwork: None ordered.  Testing/Procedures: None ordered.  Follow-Up: Your physician wants you to follow-up in: as needed with Dr. Lovena Le.     Any Other Special Instructions Will Be Listed Below (If Applicable).  If you need a refill on your cardiac medications before your next appointment, please call your pharmacy.    Implantable Loop Recorder Placement, Care After Refer to this sheet in the next few weeks. These instructions provide you with information about caring for yourself after your procedure. Your health care provider may also give you more specific instructions. Your treatment has been planned according to current medical practices, but problems sometimes occur. Call your health care provider if you have any problems or questions after your procedure. What can I expect after the procedure? After the procedure, it is common to have:  Soreness or pain near the cut from surgery (incision).  Some swelling or bruising near the incision.  Follow these instructions at home: Medicines  Take over-the-counter and prescription medicines only as told by your health care provider.  If you were prescribed an antibiotic medicine, take it as told by your health care provider. Do not stop taking the antibiotic even if you start to feel better. Bathing  Do not take baths, swim, or use a hot tub until your health care provider approves. Ask your health care provider if you can take showers. You may only be allowed to take sponge baths for bathing. Incision care  Follow instructions from your health care provider about how to take care of your incision. Make sure you: ? Wash your hands with soap and water before you change your bandage (dressing). If soap and water are not  available, use hand sanitizer. ? Change your dressing as told by your health care provider. ? Keep your dressing dry. ? Leave stitches (sutures), skin glue, or adhesive strips in place. These skin closures may need to stay in place for 2 weeks or longer. If adhesive strip edges start to loosen and curl up, you may trim the loose edges. Do not remove adhesive strips completely unless your health care provider tells you to do that.  Check your incision area every day for signs of infection. Check for: ? More redness, swelling, or pain. ? Fluid or blood. ? Warmth. ? Pus or a bad smell. Contact a health care provider if:  You have more redness, swelling, or pain around your incision.  You have more fluid or blood coming from your incision.  Your incision feels warm to the touch.  You have pus or a bad smell coming from your incision.  You have a fever.  You have pain that is not relieved by your pain medicine.  You have triggered your device because of fainting (syncope) or because of a heartbeat that feels like it is racing, slow, fluttering, or skipping (palpitations). Get help right away if:  You have chest pain.  You have difficulty breathing. This information is not intended to replace advice given to you by your health care provider. Make sure you discuss any questions you have with your health care provider. Document Released: 04/20/2015 Document Revised: 10/15/2015 Document Reviewed: 02/11/2015 Elsevier Interactive Patient Education  [employer].

## 2018-01-05 NOTE — Progress Notes (Addendum)
HPI Shannon Collins returns today for followup. She is a pleasant 67 yo woman with a cryptogenic stroke sustained over 3 years ago. She continues exercising. No edema. No chest pain. Her ILR has reached end of service  Allergies  Allergen Reactions  . Azithromycin     rash  . Biaxin [Clarithromycin]     "does not remember reaction"  . Codeine     Feels like she will "pass out"  . Remicade [Infliximab] Other (See Comments)    "severe indigestion" per pt  . Tramadol Other (See Comments)    Feels as though I will pass out     Current Outpatient Medications  Medication Sig Dispense Refill  . Abatacept (ORENCIA IV) Inject 1 Dose into the vein every 28 (twenty-eight) days.    Marland Kitchen albuterol (PROVENTIL HFA;VENTOLIN HFA) 108 (90 Base) MCG/ACT inhaler Inhale 1-2 puffs into the lungs every 6 (six) hours as needed for wheezing or shortness of breath.    Marland Kitchen atorvastatin (LIPITOR) 10 MG tablet Take 10 mg by mouth every other day.    . beta carotene w/minerals (OCUVITE) tablet Take 1 tablet by mouth daily.    . calcium carbonate (OS-CAL) 600 MG TABS tablet Take 600 mg by mouth 2 (two) times daily.     . Cholecalciferol (VITAMIN D) 2000 UNITS CAPS Take 2,000 Units by mouth daily.     . clopidogrel (PLAVIX) 75 MG tablet Take 1 tablet (75 mg total) by mouth daily. 30 tablet 11  . CRANBERRY EXTRACT PO Take 1 tablet by mouth every other day.    . cycloSPORINE (RESTASIS) 0.05 % ophthalmic emulsion Place 1 drop into both eyes 2 (two) times daily.     . fluticasone (FLONASE) 50 MCG/ACT nasal spray Place 1 spray into both nostrils daily.    Marland Kitchen GRAPE SEED CR PO Take 200 mg by mouth daily.     Marland Kitchen levothyroxine (SYNTHROID, LEVOTHROID) 50 MCG tablet Take 50 mcg by mouth daily before breakfast.    . lithium carbonate 150 MG capsule Take 150 mg by mouth at bedtime.  0  . LORazepam (ATIVAN) 1 MG tablet Take 1 mg by mouth as needed for sleep.  1  . Omega-3 Fatty Acids (FISH OIL) 1000 MG CAPS Take 2,000 mg by mouth  2 (two) times daily.     . pantoprazole (PROTONIX) 40 MG tablet Take 40 mg by mouth 2 (two) times daily.    . Pyridoxine HCl (VITAMIN B-6) 500 MG tablet Take 500 mg by mouth daily.    . QUEtiapine Fumarate (SEROQUEL XR) 150 MG 24 hr tablet Take 75 mg by mouth at bedtime.    . ranitidine (ZANTAC) 150 MG tablet Take 1 tablet (150 mg total) by mouth 2 (two) times daily. 60 tablet 0  . vitamin C (ASCORBIC ACID) 500 MG tablet Take 500 mg by mouth daily.    . vitamin E 400 UNIT capsule Take 400 Units by mouth daily.     No current facility-administered medications for this visit.      Past Medical History:  Diagnosis Date  . Acute CVA (cerebrovascular accident) ([place]) 09/24/2014  . Asthma   . Bipolar disorder ([place]) 09/24/2014  . Cerebral embolism with cerebral infarction ([place])   . Chronic cough 07/08/2014   Spirometry 06/2014: normal CXR 06/2014: clear   . High cholesterol   . Hyperlipidemia 09/24/2014  . Hypothyroidism 09/24/2014  . Rheumatoid arthritis ([place])   . Right arm weakness   . Stroke (  [place])   . Stroke with cerebral ischemia ([place])   . Thyroid condition   . TIA (transient ischemic attack) 09/24/2014    ROS:   All systems reviewed and negative except as noted in the HPI.   Past Surgical History:  Procedure Laterality Date  . CESAREAN SECTION    . EP IMPLANTABLE DEVICE N/A 09/25/2014   Procedure: Loop Recorder Insertion;  Surgeon: Evans Lance, MD;  Location: [facility] INVASIVE CV LAB CUPID;  Service: Cardiovascular;  Laterality: N/A;  . FOOT SURGERY     x2  . TEE WITHOUT CARDIOVERSION N/A 09/25/2014   Procedure: TRANSESOPHAGEAL ECHOCARDIOGRAM (TEE);  Surgeon: Fay Records, MD;  Location: [facility] ENDOSCOPY;  Service: Cardiovascular;  Laterality: N/A;     Family History  Problem Relation Age of Onset  . Asthma Father      Social History   Socioeconomic History  . Marital status: Married    Spouse name: Not on file  . Number of children: 3  . Years of education: Not on file  . Highest  education level: Not on file  Occupational History  . Occupation: retired  Scientific laboratory technician  . Financial resource strain: Not on file  . Food insecurity:    Worry: Not on file    Inability: Not on file  . Transportation needs:    Medical: Not on file    Non-medical: Not on file  Tobacco Use  . Smoking status: Never Smoker  . Smokeless tobacco: Never Used  Substance and Sexual Activity  . Alcohol use: No    Alcohol/week: 0.0 standard drinks  . Drug use: No  . Sexual activity: Never  Lifestyle  . Physical activity:    Days per week: Not on file    Minutes per session: Not on file  . Stress: Not on file  Relationships  . Social connections:    Talks on phone: Not on file    Gets together: Not on file    Attends religious service: Not on file    Active member of club or organization: Not on file    Attends meetings of clubs or organizations: Not on file    Relationship status: Not on file  . Intimate partner violence:    Fear of current or ex partner: Not on file    Emotionally abused: Not on file    Physically abused: Not on file    Forced sexual activity: Not on file  Other Topics Concern  . Not on file  Social History Narrative  . Not on file     BP 120/60   Pulse 77   Ht 5\' 3"  (1.6 m)   Wt 120 lb (54.4 kg)   BMI 21.26 kg/m   Physical Exam:  Well appearing 67 yo woman, NAD HEENT: Unremarkable Neck:  No JVD, no thyromegally Lymphatics:  No adenopathy Back:  No CVA tenderness Lungs:  Clear with no wheezes HEART:  Regular rate rhythm, no murmurs, no rubs, no clicks Abd:  soft, positive bowel sounds, no organomegally, no rebound, no guarding Ext:  2 plus pulses, no edema, no cyanosis, no clubbing Skin:  No rashes no nodules Neuro:  CN II through XII intact, motor grossly intact  EKG - nsr with rightward axis.  DEVICE  Normal device function.  See PaceArt for details.   Assess/Plan: 1. Cryptogenic stroke - over 3 years she has had no atrial fib. She will  undergo removal. I have discussed the indications/risks/benefits/ of ILR removal in our office  and she would like to proceed. This was performed in the office and dictated on a separate note.  2. HTN - her blood pressure is well controlled.   Emalina Dubreuil,M.D.  Addendum  EP Procedure Note  Procedure performed: ILR removal  Indication: device at end of service  Initial indication: syncope  Description of the procedure: after informed consent was obtained, the patient was prepped and draped in a sterile fashion. 15 cc of lidocaine was infiltrated into the left pectoral region near the old ILR incision. A one cm stab incision was made. Blunt dissection was used to dissect down to the ILR. The fibrous scar tissue was removed with a knife. The ILR was removed with gentle traction. Benzoin and steristrips were placed on the skin. A bandage was placed. The patient tolerated the procedure well.  Complications: none immediately  Conclusion: successful removal of an ILR which had been initially implanted for syncope and had reached end of service.  Mikle Bosworth.D.

## 2018-01-07 ENCOUNTER — Encounter: Payer: Self-pay | Admitting: Internal Medicine

## 2018-01-09 ENCOUNTER — Other Ambulatory Visit: Payer: Self-pay | Admitting: Internal Medicine

## 2018-01-10 ENCOUNTER — Encounter (HOSPITAL_COMMUNITY)
Admission: RE | Admit: 2018-01-10 | Discharge: 2018-01-10 | Disposition: A | Discharge status: Home / Self Care | Payer: Medicare Other | Source: Ambulatory Visit | Attending: Gastroenterology | Admitting: Gastroenterology

## 2018-01-10 DIAGNOSIS — R1011 Right upper quadrant pain: Secondary | ICD-10-CM | POA: Insufficient documentation

## 2018-01-10 MED ORDER — TECHNETIUM TC 99M MEBROFENIN IV KIT
5.3000 | PACK | Freq: Once | INTRAVENOUS | Status: AC | PRN
  Administered 2018-01-10: 5.3 via INTRAVENOUS

## 2018-01-25 DIAGNOSIS — M0579 Rheumatoid arthritis with rheumatoid factor of multiple sites without organ or systems involvement: Secondary | ICD-10-CM | POA: Diagnosis not present

## 2018-02-01 DIAGNOSIS — Z23 Encounter for immunization: Secondary | ICD-10-CM | POA: Diagnosis not present

## 2018-02-08 DIAGNOSIS — R05 Cough: Secondary | ICD-10-CM | POA: Diagnosis not present

## 2018-03-02 DIAGNOSIS — M0579 Rheumatoid arthritis with rheumatoid factor of multiple sites without organ or systems involvement: Secondary | ICD-10-CM | POA: Diagnosis not present

## 2018-03-14 ENCOUNTER — Encounter: Payer: Self-pay | Admitting: Emergency Medicine

## 2018-03-14 DIAGNOSIS — F411 Generalized anxiety disorder: Secondary | ICD-10-CM | POA: Insufficient documentation

## 2018-03-18 ENCOUNTER — Other Ambulatory Visit: Payer: Self-pay | Admitting: Psychiatry

## 2018-03-19 ENCOUNTER — Other Ambulatory Visit: Payer: Self-pay

## 2018-03-26 ENCOUNTER — Encounter: Payer: Self-pay | Admitting: Psychiatry

## 2018-03-26 ENCOUNTER — Ambulatory Visit (INDEPENDENT_AMBULATORY_CARE_PROVIDER_SITE_OTHER): Payer: Medicare Other | Admitting: Psychiatry

## 2018-03-26 DIAGNOSIS — F3174 Bipolar disorder, in full remission, most recent episode manic: Secondary | ICD-10-CM

## 2018-03-26 DIAGNOSIS — I639 Cerebral infarction, unspecified: Secondary | ICD-10-CM

## 2018-03-26 MED ORDER — LORAZEPAM 1 MG PO TABS: 1.0000 mg | ORAL_TABLET | Freq: Every evening | ORAL | 1 refills | Status: DC | PRN

## 2018-03-26 NOTE — Progress Notes (Signed)
Shannon Collins [id number] 31-Oct-1950 67 y.o.  Subjective:   Patient ID:  Shannon Collins is a 67 y.o. (DOB 12-Dec-1950) female.  Chief Complaint:  Chief Complaint  Patient presents with  . Follow-up    bipolar 1 with hx psychosis    HPI Shannon Collins presents to the office today for follow-up of bipolar as noted.  Very med sensitive.  Doing well with mood.  Did add low dose Lithium as discussed before.  I can tell a difference positive more calm even with the low dose Lithium.  Feels more stable.  Tolerates the low dose with still a little tremor.  Thinks B6 helped tremor.  Pleased with meds.  No mood swings.  No fearfulness nor confusion.  Patient reports stable mood and denies depressed or irritable moods.  Patient denies any recent difficulty with anxiety.  Patient denies difficulty with sleep initiation or maintenance, unless she has something to do the next day then feels anxious and needs lorazepam to sleep.  It works.  Denies appetite disturbance.  Patient reports that energy and motivation have been good.  Patient denies any difficulty with concentration.  Patient denies any suicidal ideation.   Review of Systems:  Review of Systems  Respiratory: Positive for cough.   Neurological: Positive for tremors. Negative for weakness.  Psychiatric/Behavioral: Positive for suicidal ideas. Negative for agitation, behavioral problems, confusion, decreased concentration, dysphoric mood, hallucinations, self-injury and sleep disturbance. The patient is not nervous/anxious and is not hyperactive.     Medications: I have reviewed the patient's current medications.  Current Outpatient Medications  Medication Sig Dispense Refill  . Abatacept (ORENCIA IV) Inject 1 Dose into the vein every 28 (twenty-eight) days.    Marland Kitchen albuterol (PROVENTIL HFA;VENTOLIN HFA) 108 (90 Base) MCG/ACT inhaler Inhale 1-2 puffs into the lungs every 6 (six) hours as needed for wheezing or shortness of breath.    Marland Kitchen  aspirin EC 81 MG tablet Take 1 tablet (81 mg total) by mouth daily. 90 tablet 3  . atorvastatin (LIPITOR) 10 MG tablet Take 10 mg by mouth every other day.    . beta carotene w/minerals (OCUVITE) tablet Take 1 tablet by mouth daily.    . calcium carbonate (OS-CAL) 600 MG TABS tablet Take 600 mg by mouth 2 (two) times daily.     . Cholecalciferol (VITAMIN D) 2000 UNITS CAPS Take 2,000 Units by mouth daily.     Marland Kitchen CRANBERRY EXTRACT PO Take 1 tablet by mouth every other day.    . cycloSPORINE (RESTASIS) 0.05 % ophthalmic emulsion Place 1 drop into both eyes 2 (two) times daily.     . fluticasone (FLONASE) 50 MCG/ACT nasal spray Place 1 spray into both nostrils daily.    Marland Kitchen GRAPE SEED CR PO Take 200 mg by mouth daily.     Marland Kitchen levothyroxine (SYNTHROID, LEVOTHROID) 50 MCG tablet Take 50 mcg by mouth daily before breakfast.    . lithium carbonate 150 MG capsule TAKE 1 CAPSULE BY MOUTH AT BEDTIME 90 capsule 0  . LORazepam (ATIVAN) 1 MG tablet Take 1 mg by mouth as needed for sleep.  1  . Omega-3 Fatty Acids (FISH OIL) 1000 MG CAPS Take 2,000 mg by mouth 2 (two) times daily.     . pantoprazole (PROTONIX) 40 MG tablet Take 40 mg by mouth 2 (two) times daily.    . Pyridoxine HCl (VITAMIN B-6) 500 MG tablet Take 500 mg by mouth daily.    . QUEtiapine Fumarate (SEROQUEL XR) 150  MG 24 hr tablet Take 75 mg by mouth at bedtime.    . vitamin C (ASCORBIC ACID) 500 MG tablet Take 500 mg by mouth daily.    . vitamin E 400 UNIT capsule Take 400 Units by mouth daily.    . ranitidine (ZANTAC) 150 MG tablet Take 1 tablet (150 mg total) by mouth 2 (two) times daily. (Patient not taking: Reported on 03/26/2018) 60 tablet 0   No current facility-administered medications for this visit.     Medication Side Effects: Other: ? tremor related  Allergies:  Allergies  Allergen Reactions  . Azithromycin     rash  . Biaxin [Clarithromycin]     "does not remember reaction"  . Codeine     Feels like she will "pass out"  .  Remicade [Infliximab] Other (See Comments)    "severe indigestion" per pt  . Tramadol Other (See Comments)    Feels as though I will pass out    Past Medical History:  Diagnosis Date  . Acute CVA (cerebrovascular accident) ([place]) 09/24/2014  . Asthma   . Bipolar disorder ([place]) 09/24/2014  . Cerebral embolism with cerebral infarction ([place])   . Chronic cough 07/08/2014   Spirometry 06/2014: normal CXR 06/2014: clear   . High cholesterol   . Hyperlipidemia 09/24/2014  . Hypothyroidism 09/24/2014  . Rheumatoid arthritis ([place])   . Right arm weakness   . Stroke ([place])   . Stroke with cerebral ischemia ([place])   . Thyroid condition   . TIA (transient ischemic attack) 09/24/2014    Family History  Problem Relation Age of Onset  . Asthma Father     Social History   Socioeconomic History  . Marital status: Married    Spouse name: Not on file  . Number of children: 3  . Years of education: Not on file  . Highest education level: Not on file  Occupational History  . Occupation: retired  Scientific laboratory technician  . Financial resource strain: Not on file  . Food insecurity:    Worry: Not on file    Inability: Not on file  . Transportation needs:    Medical: Not on file    Non-medical: Not on file  Tobacco Use  . Smoking status: Never Smoker  . Smokeless tobacco: Never Used  Substance and Sexual Activity  . Alcohol use: No    Alcohol/week: 0.0 standard drinks  . Drug use: No  . Sexual activity: Never  Lifestyle  . Physical activity:    Days per week: Not on file    Minutes per session: Not on file  . Stress: Not on file  Relationships  . Social connections:    Talks on phone: Not on file    Gets together: Not on file    Attends religious service: Not on file    Active member of club or organization: Not on file    Attends meetings of clubs or organizations: Not on file    Relationship status: Not on file  . Intimate partner violence:    Fear of current or ex partner: Not on file     Emotionally abused: Not on file    Physically abused: Not on file    Forced sexual activity: Not on file  Other Topics Concern  . Not on file  Social History Narrative  . Not on file    Past Medical History, Surgical history, Social history, and Family history were reviewed and updated as appropriate.   Please see review of  systems for further details on the patient's review from today.   Objective:   Physical Exam:  There were no vitals taken for this visit.  Physical Exam  Constitutional: She is oriented to person, place, and time. She appears well-developed. No distress.  Musculoskeletal: She exhibits no deformity.  Neurological: She is alert and oriented to person, place, and time. She displays tremor. She displays no atrophy. Coordination and gait normal.  Psychiatric: She has a normal mood and affect. Her speech is normal and behavior is normal. Judgment and thought content normal. Her mood appears not anxious. Her affect is not angry, not blunt, not labile and not inappropriate. Cognition and memory are normal. She does not exhibit a depressed mood. She expresses no homicidal and no suicidal ideation. She expresses no suicidal plans and no homicidal plans.  Insight intact. No auditory or visual hallucinations. No delusions.  She is attentive.    Lab Review:     Component Value Date/Time   NA 142 12/31/2017 1630   K 3.4 (L) 12/31/2017 1630   CL 107 12/31/2017 1630   CO2 26 12/31/2017 1630   GLUCOSE 110 (H) 12/31/2017 1630   BUN 17 12/31/2017 1630   CREATININE 0.79 12/31/2017 1630   CALCIUM 9.9 12/31/2017 1630   PROT 7.9 12/31/2017 1630   ALBUMIN 4.3 12/31/2017 1630   AST 33 12/31/2017 1630   ALT 23 12/31/2017 1630   ALKPHOS 75 12/31/2017 1630   BILITOT 0.4 12/31/2017 1630   GFRNONAA >60 12/31/2017 1630   GFRAA >60 12/31/2017 1630       Component Value Date/Time   WBC 8.3 12/31/2017 1630   RBC 4.54 12/31/2017 1630   HGB 13.3 12/31/2017 1630   HCT 40.9  12/31/2017 1630   PLT 343 12/31/2017 1630   MCV 90.1 12/31/2017 1630   MCH 29.3 12/31/2017 1630   MCHC 32.5 12/31/2017 1630   RDW 13.6 12/31/2017 1630   LYMPHSABS 2.2 09/24/2014 0027   MONOABS 0.5 09/24/2014 0027   EOSABS 0.5 09/24/2014 0027   BASOSABS 0.1 09/24/2014 0027  Labs good.  No results found for: POCLITH, LITHIUM   No results found for: PHENYTOIN, PHENOBARB, VALPROATE, CBMZ   .res Assessment: Plan:    Bipolar 1 disorder, manic, full remission ([place])   Greater than 50% of face to face time with patient was spent on counseling and coordination of care. We discussed her history of bipolar 1 disorder with a history of paranoid psychosis.  She is been very medication sensitive also.  She did well on the higher doses of lithium monotherapy but it caused too much tremor.  She is done surprisingly well on very low dosages of quetiapine and lithium.  As indicated in the note even the low-dose 150 lithium provided additional mood stability and antianxiety effects. Discussed potential metabolic side effects associated with atypical antipsychotics, as well as potential risk for movement side effects. Advised pt to contact office if movement side effects occur.  Counseled patient regarding potential benefits, risks, and side effects of lithium to include potential risk of lithium affecting thyroid and renal function.  Discussed need for periodic lab monitoring to determine drug level and to assess for potential adverse effects.  Counseled patient regarding signs and symptoms of lithium toxicity and advised that they notify office immediately or seek urgent medical attention if experiencing these signs and symptoms.  Patient advised to contact office with any questions or concerns.  Satisfied with meds.  This appointment was 15 minutes.  Follow-up 6  months  Lynder Parents MD, DFAPA Please see After Visit Summary for patient specific instructions.  No future appointments.  No orders of  the defined types were placed in this encounter.     -------------------------------

## 2018-03-29 DIAGNOSIS — Z6821 Body mass index (BMI) 21.0-21.9, adult: Secondary | ICD-10-CM | POA: Diagnosis not present

## 2018-03-29 DIAGNOSIS — M2550 Pain in unspecified joint: Secondary | ICD-10-CM | POA: Diagnosis not present

## 2018-03-29 DIAGNOSIS — M0579 Rheumatoid arthritis with rheumatoid factor of multiple sites without organ or systems involvement: Secondary | ICD-10-CM | POA: Diagnosis not present

## 2018-03-29 DIAGNOSIS — Z79899 Other long term (current) drug therapy: Secondary | ICD-10-CM | POA: Diagnosis not present

## 2018-03-29 DIAGNOSIS — K210 Gastro-esophageal reflux disease with esophagitis: Secondary | ICD-10-CM | POA: Diagnosis not present

## 2018-03-29 DIAGNOSIS — M150 Primary generalized (osteo)arthritis: Secondary | ICD-10-CM | POA: Diagnosis not present

## 2018-04-03 DIAGNOSIS — K219 Gastro-esophageal reflux disease without esophagitis: Secondary | ICD-10-CM | POA: Diagnosis not present

## 2018-04-03 DIAGNOSIS — Z86010 Personal history of colonic polyps: Secondary | ICD-10-CM | POA: Diagnosis not present

## 2018-04-03 DIAGNOSIS — R490 Dysphonia: Secondary | ICD-10-CM | POA: Diagnosis not present

## 2018-04-03 DIAGNOSIS — K7581 Nonalcoholic steatohepatitis (NASH): Secondary | ICD-10-CM | POA: Diagnosis not present

## 2018-04-06 DIAGNOSIS — M0579 Rheumatoid arthritis with rheumatoid factor of multiple sites without organ or systems involvement: Secondary | ICD-10-CM | POA: Diagnosis not present

## 2018-04-24 DIAGNOSIS — R05 Cough: Secondary | ICD-10-CM | POA: Diagnosis not present

## 2018-04-24 DIAGNOSIS — K219 Gastro-esophageal reflux disease without esophagitis: Secondary | ICD-10-CM | POA: Diagnosis not present

## 2018-04-24 DIAGNOSIS — K7581 Nonalcoholic steatohepatitis (NASH): Secondary | ICD-10-CM | POA: Diagnosis not present

## 2018-04-24 DIAGNOSIS — Z86010 Personal history of colonic polyps: Secondary | ICD-10-CM | POA: Diagnosis not present

## 2018-05-04 DIAGNOSIS — M0579 Rheumatoid arthritis with rheumatoid factor of multiple sites without organ or systems involvement: Secondary | ICD-10-CM | POA: Diagnosis not present

## 2018-05-04 DIAGNOSIS — Z79899 Other long term (current) drug therapy: Secondary | ICD-10-CM | POA: Diagnosis not present

## 2018-05-17 DIAGNOSIS — R05 Cough: Secondary | ICD-10-CM | POA: Diagnosis not present

## 2018-05-17 DIAGNOSIS — J45991 Cough variant asthma: Secondary | ICD-10-CM | POA: Diagnosis not present

## 2018-05-17 DIAGNOSIS — J3081 Allergic rhinitis due to animal (cat) (dog) hair and dander: Secondary | ICD-10-CM | POA: Diagnosis not present

## 2018-06-01 DIAGNOSIS — M0579 Rheumatoid arthritis with rheumatoid factor of multiple sites without organ or systems involvement: Secondary | ICD-10-CM | POA: Diagnosis not present

## 2018-06-16 ENCOUNTER — Other Ambulatory Visit: Payer: Self-pay | Admitting: Psychiatry

## 2018-06-29 DIAGNOSIS — M0579 Rheumatoid arthritis with rheumatoid factor of multiple sites without organ or systems involvement: Secondary | ICD-10-CM | POA: Diagnosis not present

## 2018-07-26 ENCOUNTER — Telehealth: Payer: Self-pay | Admitting: Psychiatry

## 2018-07-26 NOTE — Telephone Encounter (Signed)
I need the paper chart please.

## 2018-07-26 NOTE — Telephone Encounter (Signed)
Patient called and said that she is having symptons and wants to talk to the nurse. Please call her at [phone]

## 2018-07-26 NOTE — Telephone Encounter (Signed)
We can reduce her Seroquel XR to 50 mg every afternoon.  This is likely a side effect of Seroquel.  She is medication sensitive but this is a very low dosage and does increase her risk of having manic symptoms.  If she has manic symptoms let us know.  Please call in this prescription for her #31 refill and she needs to schedule an appointment within 40-month

## 2018-07-26 NOTE — Telephone Encounter (Signed)
That is Seroquel XR 50 mg every evening

## 2018-07-27 ENCOUNTER — Other Ambulatory Visit: Payer: Self-pay

## 2018-07-27 DIAGNOSIS — M0579 Rheumatoid arthritis with rheumatoid factor of multiple sites without organ or systems involvement: Secondary | ICD-10-CM | POA: Diagnosis not present

## 2018-07-27 MED ORDER — QUETIAPINE FUMARATE ER 50 MG PO TB24: 50.0000 mg | ORAL_TABLET | Freq: Every evening | ORAL | 0 refills | Status: DC

## 2018-07-27 NOTE — Telephone Encounter (Signed)
Pt aware and will try the seroquel xr 50mg . Instructed to call back with any problems or concerns. rx submitted to [facility] per request.

## 2018-08-15 ENCOUNTER — Other Ambulatory Visit: Payer: Self-pay | Admitting: Psychiatry

## 2018-08-24 DIAGNOSIS — M0579 Rheumatoid arthritis with rheumatoid factor of multiple sites without organ or systems involvement: Secondary | ICD-10-CM | POA: Diagnosis not present

## 2018-09-11 DIAGNOSIS — D2239 Melanocytic nevi of other parts of face: Secondary | ICD-10-CM | POA: Diagnosis not present

## 2018-09-11 DIAGNOSIS — D2271 Melanocytic nevi of right lower limb, including hip: Secondary | ICD-10-CM | POA: Diagnosis not present

## 2018-09-11 DIAGNOSIS — D2261 Melanocytic nevi of right upper limb, including shoulder: Secondary | ICD-10-CM | POA: Diagnosis not present

## 2018-09-11 DIAGNOSIS — L821 Other seborrheic keratosis: Secondary | ICD-10-CM | POA: Diagnosis not present

## 2018-09-11 DIAGNOSIS — L720 Epidermal cyst: Secondary | ICD-10-CM | POA: Diagnosis not present

## 2018-09-11 DIAGNOSIS — D485 Neoplasm of uncertain behavior of skin: Secondary | ICD-10-CM | POA: Diagnosis not present

## 2018-09-11 DIAGNOSIS — D2272 Melanocytic nevi of left lower limb, including hip: Secondary | ICD-10-CM | POA: Diagnosis not present

## 2018-09-11 DIAGNOSIS — D1801 Hemangioma of skin and subcutaneous tissue: Secondary | ICD-10-CM | POA: Diagnosis not present

## 2018-09-11 DIAGNOSIS — D225 Melanocytic nevi of trunk: Secondary | ICD-10-CM | POA: Diagnosis not present

## 2018-09-11 DIAGNOSIS — D2262 Melanocytic nevi of left upper limb, including shoulder: Secondary | ICD-10-CM | POA: Diagnosis not present

## 2018-09-24 DIAGNOSIS — M0579 Rheumatoid arthritis with rheumatoid factor of multiple sites without organ or systems involvement: Secondary | ICD-10-CM | POA: Diagnosis not present

## 2018-09-25 ENCOUNTER — Other Ambulatory Visit: Payer: Self-pay

## 2018-09-25 ENCOUNTER — Ambulatory Visit: Payer: Medicare Other | Admitting: Psychiatry

## 2018-09-25 ENCOUNTER — Encounter: Payer: Self-pay | Admitting: Psychiatry

## 2018-09-25 ENCOUNTER — Ambulatory Visit (INDEPENDENT_AMBULATORY_CARE_PROVIDER_SITE_OTHER): Payer: Medicare Other | Admitting: Psychiatry

## 2018-09-25 DIAGNOSIS — F3174 Bipolar disorder, in full remission, most recent episode manic: Secondary | ICD-10-CM

## 2018-09-25 DIAGNOSIS — F411 Generalized anxiety disorder: Secondary | ICD-10-CM | POA: Diagnosis not present

## 2018-09-25 MED ORDER — LORAZEPAM 1 MG PO TABS: 1.0000 mg | ORAL_TABLET | Freq: Every evening | ORAL | 1 refills | Status: DC | PRN

## 2018-09-25 MED ORDER — LITHIUM CARBONATE 150 MG PO CAPS: 150.0000 mg | ORAL_CAPSULE | Freq: Every day | ORAL | 1 refills | Status: DC

## 2018-09-25 MED ORDER — QUETIAPINE FUMARATE ER 50 MG PO TB24: 50.0000 mg | ORAL_TABLET | Freq: Every evening | ORAL | 1 refills | Status: DC

## 2018-09-25 NOTE — Progress Notes (Signed)
Shannon Collins [id number] 12-Oct-1950 68 y.o.  Subjective:   Patient ID:  Shannon Collins is a 68 y.o. (DOB [birth year]) female. Virtual Visit via [facility]  I connected with pt byWebEXand verified that I am speaking with the correct person using two identifiers.   I discussed the limitations, risks, security and privacy concerns of performing an evaluation and management service byWebEX and the availability of in person appointments. I also discussed with the patient that there may be a patient responsible charge related to this service. The patient expressed understanding and agreed to proceed.  I discussed the assessment and treatment plan with the patient. The patient was provided an opportunity to ask questions and all were answered. The patient agreed with the plan and demonstrated an understanding of the instructions.   The patient was advised to call back or seek an in-person evaluation if the symptoms worsen or if the condition fails to improve as anticipated.  I provided 15 minutes of non-face-to-face time during this encounter. The call started at [place] and ended at 935. The patient was located at home and the provider was located office.  Chief Complaint:  Chief Complaint  Patient presents with  . Follow-up    med management    HPI Shannon Collins   FU bipolar and anxiety and insomnia.  Only takes lorazepam about twice weekly.  Was really stressed with Covid in the beginning but has adjusted.  No unusual mood swings and hisband has not noted anything and he usually will.  Sleep good unless has somewhere to go the next morning and then gets anxious.   Very med sensitive.  Doing well with mood.  Before last visit in November we added low dose Lithium as discussed before.  I can tell a difference positive more calm even with the low dose Lithium.  Feels more stable.  Tolerates the low dose with still a little tremor.  Thinks B6 helped tremor.  Pleased with meds.  No mood swings.   No fearfulness nor confusion.  Patient reports stable mood and denies depressed or irritable moods.  Patient denies any recent difficulty with anxiety.  Patient denies difficulty with sleep initiation or maintenance, unless she has something to do the next day then feels anxious and needs lorazepam to sleep.  It works.  Denies appetite disturbance.  Patient reports that energy and motivation have been good.  Patient denies any difficulty with concentration.  Patient denies any suicidal ideation.   Review of Systems:  Review of Systems  Respiratory: Negative for cough.   Neurological: Positive for tremors. Negative for weakness.  Psychiatric/Behavioral: Negative for agitation, behavioral problems, confusion, decreased concentration, dysphoric mood, hallucinations, self-injury, sleep disturbance and suicidal ideas. The patient is not nervous/anxious and is not hyperactive.     Medications: I have reviewed the patient's current medications.  Current Outpatient Medications  Medication Sig Dispense Refill  . Abatacept (ORENCIA IV) Inject 1 Dose into the vein every 28 (twenty-eight) days.    Marland Kitchen albuterol (PROVENTIL HFA;VENTOLIN HFA) 108 (90 Base) MCG/ACT inhaler Inhale 1-2 puffs into the lungs every 6 (six) hours as needed for wheezing or shortness of breath.    Marland Kitchen aspirin EC 81 MG tablet Take 1 tablet (81 mg total) by mouth daily. 90 tablet 3  . atorvastatin (LIPITOR) 10 MG tablet Take 10 mg by mouth every other day.    . beta carotene w/minerals (OCUVITE) tablet Take 1 tablet by mouth daily.    . calcium carbonate (OS-CAL) 600  MG TABS tablet Take 600 mg by mouth 2 (two) times daily.     . Cholecalciferol (VITAMIN D) 2000 UNITS CAPS Take 2,000 Units by mouth daily.     Marland Kitchen CRANBERRY EXTRACT PO Take 1 tablet by mouth every other day.    . cycloSPORINE (RESTASIS) 0.05 % ophthalmic emulsion Place 1 drop into both eyes 2 (two) times daily.     . fluticasone (FLONASE) 50 MCG/ACT nasal spray Place 1 spray  into both nostrils daily.    Marland Kitchen GRAPE SEED CR PO Take 200 mg by mouth daily.     Marland Kitchen levothyroxine (SYNTHROID, LEVOTHROID) 50 MCG tablet Take 50 mcg by mouth daily before breakfast.    . lithium carbonate 150 MG capsule Take 1 capsule (150 mg total) by mouth at bedtime. 90 capsule 1  . LORazepam (ATIVAN) 1 MG tablet Take 1 tablet (1 mg total) by mouth at bedtime as needed for sleep. 90 tablet 1  . Omega-3 Fatty Acids (FISH OIL) 1000 MG CAPS Take 2,000 mg by mouth 2 (two) times daily.     . pantoprazole (PROTONIX) 40 MG tablet Take 40 mg by mouth 2 (two) times daily.    . Pyridoxine HCl (VITAMIN B-6) 500 MG tablet Take 500 mg by mouth daily.    . QUEtiapine (SEROQUEL XR) 50 MG TB24 24 hr tablet Take 1 tablet (50 mg total) by mouth every evening. 90 each 1  . vitamin C (ASCORBIC ACID) 500 MG tablet Take 500 mg by mouth daily.    . vitamin E 400 UNIT capsule Take 400 Units by mouth daily.    . ranitidine (ZANTAC) 150 MG tablet Take 1 tablet (150 mg total) by mouth 2 (two) times daily. (Patient not taking: Reported on 03/26/2018) 60 tablet 0   No current facility-administered medications for this visit.     Medication Side Effects: Other: ? tremor related  Allergies:  Allergies  Allergen Reactions  . Azithromycin     rash  . Biaxin [Clarithromycin]     "does not remember reaction"  . Codeine     Feels like she will "pass out"  . Remicade [Infliximab] Other (See Comments)    "severe indigestion" per pt  . Tramadol Other (See Comments)    Feels as though I will pass out    Past Medical History:  Diagnosis Date  . Acute CVA (cerebrovascular accident) ([place]) 09/24/2014  . Asthma   . Bipolar disorder ([place]) 09/24/2014  . Cerebral embolism with cerebral infarction ([place])   . Chronic cough 07/08/2014   Spirometry 06/2014: normal CXR 06/2014: clear   . High cholesterol   . Hyperlipidemia 09/24/2014  . Hypothyroidism 09/24/2014  . Rheumatoid arthritis ([place])   . Right arm weakness   . Stroke ([facility])   .  Stroke with cerebral ischemia ([place])   . Thyroid condition   . TIA (transient ischemic attack) 09/24/2014    Family History  Problem Relation Age of Onset  . Asthma Father     Social History   Socioeconomic History  . Marital status: Married    Spouse name: Not on file  . Number of children: 3  . Years of education: Not on file  . Highest education level: Not on file  Occupational History  . Occupation: retired  Scientific laboratory technician  . Financial resource strain: Not on file  . Food insecurity:    Worry: Not on file    Inability: Not on file  . Transportation needs:    Medical: Not  on file    Non-medical: Not on file  Tobacco Use  . Smoking status: Never Smoker  . Smokeless tobacco: Never Used  Substance and Sexual Activity  . Alcohol use: No    Alcohol/week: 0.0 standard drinks  . Drug use: No  . Sexual activity: Never  Lifestyle  . Physical activity:    Days per week: Not on file    Minutes per session: Not on file  . Stress: Not on file  Relationships  . Social connections:    Talks on phone: Not on file    Gets together: Not on file    Attends religious service: Not on file    Active member of club or organization: Not on file    Attends meetings of clubs or organizations: Not on file    Relationship status: Not on file  . Intimate partner violence:    Fear of current or ex partner: Not on file    Emotionally abused: Not on file    Physically abused: Not on file    Forced sexual activity: Not on file  Other Topics Concern  . Not on file  Social History Narrative  . Not on file    Past Medical History, Surgical history, Social history, and Family history were reviewed and updated as appropriate.   Please see review of systems for further details on the patient's review from today.   Objective:   Physical Exam:  There were no vitals taken for this visit.  Physical Exam Neurological:     Mental Status: She is alert and oriented to person, place, and time.      Cranial Nerves: No dysarthria.  Psychiatric:        Attention and Perception: Attention normal.        Mood and Affect: Mood is anxious.        Speech: Speech normal.        Behavior: Behavior is cooperative.        Thought Content: Thought content normal. Thought content is not paranoid or delusional. Thought content does not include homicidal or suicidal ideation. Thought content does not include homicidal or suicidal plan.        Cognition and Memory: Cognition and memory normal.        Judgment: Judgment normal.     Comments: Easily anxious but manageable.     Lab Review:     Component Value Date/Time   NA 142 12/31/2017 1630   K 3.4 (L) 12/31/2017 1630   CL 107 12/31/2017 1630   CO2 26 12/31/2017 1630   GLUCOSE 110 (H) 12/31/2017 1630   BUN 17 12/31/2017 1630   CREATININE 0.79 12/31/2017 1630   CALCIUM 9.9 12/31/2017 1630   PROT 7.9 12/31/2017 1630   ALBUMIN 4.3 12/31/2017 1630   AST 33 12/31/2017 1630   ALT 23 12/31/2017 1630   ALKPHOS 75 12/31/2017 1630   BILITOT 0.4 12/31/2017 1630   GFRNONAA >60 12/31/2017 1630   GFRAA >60 12/31/2017 1630       Component Value Date/Time   WBC 8.3 12/31/2017 1630   RBC 4.54 12/31/2017 1630   HGB 13.3 12/31/2017 1630   HCT 40.9 12/31/2017 1630   PLT 343 12/31/2017 1630   MCV 90.1 12/31/2017 1630   MCH 29.3 12/31/2017 1630   MCHC 32.5 12/31/2017 1630   RDW 13.6 12/31/2017 1630   LYMPHSABS 2.2 09/24/2014 0027   MONOABS 0.5 09/24/2014 0027   EOSABS 0.5 09/24/2014 0027   BASOSABS 0.1 09/24/2014  1791  Labs good.  No results found for: POCLITH, LITHIUM   No results found for: PHENYTOIN, PHENOBARB, VALPROATE, CBMZ   .res Assessment: Plan:    Bipolar 1 disorder, manic, full remission ([place]) - Plan: QUEtiapine (SEROQUEL XR) 50 MG TB24 24 hr tablet, lithium carbonate 150 MG capsule, LORazepam (ATIVAN) 1 MG tablet  Generalized anxiety disorder - Plan: LORazepam (ATIVAN) 1 MG tablet   We discussed her history of bipolar 1  disorder with a history of paranoid psychosis.  She is been very medication sensitive also.  She did well on the higher doses of lithium monotherapy but it caused too much tremor.  She is done surprisingly well on very low dosages of quetiapine and lithium.  As indicated in the note even the low-dose 150 lithium provided additional mood stability and antianxiety effects.  Discussed potential metabolic side effects associated with atypical antipsychotics, as well as potential risk for movement side effects. Advised pt to contact office if movement side effects occur.   Counseled patient regarding potential benefits, risks, and side effects of lithium to include potential risk of lithium affecting thyroid and renal function.  Discussed need for periodic lab monitoring to determine drug level and to assess for potential adverse effects.  Counseled patient regarding signs and symptoms of lithium toxicity and advised that they notify office immediately or seek urgent medical attention if experiencing these signs and symptoms.  Patient advised to contact office with any questions or concerns.  Satisfied with meds.  This appointment was 15 minutes.  Follow-up 6 months  Lynder Parents MD, DFAPA Please see After Visit Summary for patient specific instructions.  No future appointments.  No orders of the defined types were placed in this encounter.     -------------------------------

## 2018-10-13 ENCOUNTER — Other Ambulatory Visit: Payer: Self-pay

## 2018-10-13 ENCOUNTER — Emergency Department (HOSPITAL_COMMUNITY): Payer: No Typology Code available for payment source

## 2018-10-13 ENCOUNTER — Encounter (HOSPITAL_COMMUNITY): Payer: Self-pay

## 2018-10-13 ENCOUNTER — Emergency Department (HOSPITAL_COMMUNITY)
Admission: EM | Admit: 2018-10-13 | Discharge: 2018-10-13 | Disposition: A | Discharge status: Home / Self Care | Payer: No Typology Code available for payment source | Attending: Emergency Medicine | Admitting: Emergency Medicine

## 2018-10-13 DIAGNOSIS — Y9389 Activity, other specified: Secondary | ICD-10-CM | POA: Insufficient documentation

## 2018-10-13 DIAGNOSIS — Y999 Unspecified external cause status: Secondary | ICD-10-CM | POA: Diagnosis not present

## 2018-10-13 DIAGNOSIS — M47812 Spondylosis without myelopathy or radiculopathy, cervical region: Secondary | ICD-10-CM | POA: Diagnosis not present

## 2018-10-13 DIAGNOSIS — Z7982 Long term (current) use of aspirin: Secondary | ICD-10-CM | POA: Insufficient documentation

## 2018-10-13 DIAGNOSIS — S0990XA Unspecified injury of head, initial encounter: Secondary | ICD-10-CM | POA: Diagnosis not present

## 2018-10-13 DIAGNOSIS — Z8673 Personal history of transient ischemic attack (TIA), and cerebral infarction without residual deficits: Secondary | ICD-10-CM | POA: Insufficient documentation

## 2018-10-13 DIAGNOSIS — R51 Headache: Secondary | ICD-10-CM | POA: Insufficient documentation

## 2018-10-13 DIAGNOSIS — Z79899 Other long term (current) drug therapy: Secondary | ICD-10-CM | POA: Diagnosis not present

## 2018-10-13 DIAGNOSIS — M542 Cervicalgia: Secondary | ICD-10-CM | POA: Diagnosis not present

## 2018-10-13 DIAGNOSIS — M4302 Spondylolysis, cervical region: Secondary | ICD-10-CM | POA: Diagnosis not present

## 2018-10-13 DIAGNOSIS — S199XXA Unspecified injury of neck, initial encounter: Secondary | ICD-10-CM | POA: Diagnosis not present

## 2018-10-13 DIAGNOSIS — Y92410 Unspecified street and highway as the place of occurrence of the external cause: Secondary | ICD-10-CM | POA: Insufficient documentation

## 2018-10-13 DIAGNOSIS — E039 Hypothyroidism, unspecified: Secondary | ICD-10-CM | POA: Insufficient documentation

## 2018-10-13 DIAGNOSIS — J45909 Unspecified asthma, uncomplicated: Secondary | ICD-10-CM | POA: Insufficient documentation
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
## 2018-10-22 DIAGNOSIS — M8589 Other specified disorders of bone density and structure, multiple sites: Secondary | ICD-10-CM | POA: Diagnosis not present

## 2018-10-22 DIAGNOSIS — M0689 Other specified rheumatoid arthritis, multiple sites: Secondary | ICD-10-CM | POA: Diagnosis not present

## 2018-10-22 DIAGNOSIS — R252 Cramp and spasm: Secondary | ICD-10-CM | POA: Diagnosis not present

## 2018-10-22 DIAGNOSIS — J4520 Mild intermittent asthma, uncomplicated: Secondary | ICD-10-CM | POA: Diagnosis not present

## 2018-10-22 DIAGNOSIS — M0579 Rheumatoid arthritis with rheumatoid factor of multiple sites without organ or systems involvement: Secondary | ICD-10-CM | POA: Diagnosis not present

## 2018-10-22 DIAGNOSIS — F319 Bipolar disorder, unspecified: Secondary | ICD-10-CM | POA: Diagnosis not present

## 2018-10-22 DIAGNOSIS — J309 Allergic rhinitis, unspecified: Secondary | ICD-10-CM | POA: Diagnosis not present

## 2018-10-22 DIAGNOSIS — K219 Gastro-esophageal reflux disease without esophagitis: Secondary | ICD-10-CM | POA: Diagnosis not present

## 2018-10-22 DIAGNOSIS — E782 Mixed hyperlipidemia: Secondary | ICD-10-CM | POA: Diagnosis not present

## 2018-10-22 DIAGNOSIS — Z8673 Personal history of transient ischemic attack (TIA), and cerebral infarction without residual deficits: Secondary | ICD-10-CM | POA: Diagnosis not present

## 2018-10-22 DIAGNOSIS — E039 Hypothyroidism, unspecified: Secondary | ICD-10-CM | POA: Diagnosis not present

## 2018-10-22 DIAGNOSIS — Z79899 Other long term (current) drug therapy: Secondary | ICD-10-CM | POA: Diagnosis not present

## 2018-10-23 DIAGNOSIS — M8589 Other specified disorders of bone density and structure, multiple sites: Secondary | ICD-10-CM | POA: Diagnosis not present

## 2018-10-23 DIAGNOSIS — E782 Mixed hyperlipidemia: Secondary | ICD-10-CM | POA: Diagnosis not present

## 2018-10-23 DIAGNOSIS — M0689 Other specified rheumatoid arthritis, multiple sites: Secondary | ICD-10-CM | POA: Diagnosis not present

## 2018-10-23 DIAGNOSIS — Z8673 Personal history of transient ischemic attack (TIA), and cerebral infarction without residual deficits: Secondary | ICD-10-CM | POA: Diagnosis not present

## 2018-10-23 DIAGNOSIS — E039 Hypothyroidism, unspecified: Secondary | ICD-10-CM | POA: Diagnosis not present

## 2018-10-23 DIAGNOSIS — K219 Gastro-esophageal reflux disease without esophagitis: Secondary | ICD-10-CM | POA: Diagnosis not present

## 2018-10-23 DIAGNOSIS — J4520 Mild intermittent asthma, uncomplicated: Secondary | ICD-10-CM | POA: Diagnosis not present

## 2018-10-23 DIAGNOSIS — R252 Cramp and spasm: Secondary | ICD-10-CM | POA: Diagnosis not present

## 2018-10-23 DIAGNOSIS — Z79899 Other long term (current) drug therapy: Secondary | ICD-10-CM | POA: Diagnosis not present

## 2018-10-23 DIAGNOSIS — E559 Vitamin D deficiency, unspecified: Secondary | ICD-10-CM | POA: Diagnosis not present

## 2018-10-25 DIAGNOSIS — E782 Mixed hyperlipidemia: Secondary | ICD-10-CM | POA: Diagnosis not present

## 2018-10-25 DIAGNOSIS — Z0000 Encounter for general adult medical examination without abnormal findings: Secondary | ICD-10-CM | POA: Diagnosis not present

## 2018-11-02 ENCOUNTER — Telehealth: Payer: Self-pay | Admitting: Psychiatry

## 2018-11-02 DIAGNOSIS — H00015 Hordeolum externum left lower eyelid: Secondary | ICD-10-CM | POA: Diagnosis not present

## 2018-11-02 NOTE — Telephone Encounter (Signed)
Left pt. A detailed message and advised to call back with any questions.

## 2018-11-02 NOTE — Telephone Encounter (Signed)
Patient called and wants to know if dr. Clovis Pu approves her to take prevagen over the counter medicine? Please call her at [phone]

## 2018-11-02 NOTE — Telephone Encounter (Signed)
It should be okay as long as it does not interfere with her sleep or make her nervous.

## 2018-11-20 DIAGNOSIS — M0579 Rheumatoid arthritis with rheumatoid factor of multiple sites without organ or systems involvement: Secondary | ICD-10-CM | POA: Diagnosis not present

## 2018-12-18 DIAGNOSIS — M0579 Rheumatoid arthritis with rheumatoid factor of multiple sites without organ or systems involvement: Secondary | ICD-10-CM | POA: Diagnosis not present

## 2018-12-18 DIAGNOSIS — R7989 Other specified abnormal findings of blood chemistry: Secondary | ICD-10-CM | POA: Diagnosis not present

## 2018-12-24 DIAGNOSIS — E786 Lipoprotein deficiency: Secondary | ICD-10-CM | POA: Diagnosis not present

## 2018-12-25 DIAGNOSIS — M0579 Rheumatoid arthritis with rheumatoid factor of multiple sites without organ or systems involvement: Secondary | ICD-10-CM | POA: Diagnosis not present

## 2018-12-25 DIAGNOSIS — M150 Primary generalized (osteo)arthritis: Secondary | ICD-10-CM | POA: Diagnosis not present

## 2018-12-25 DIAGNOSIS — Z79899 Other long term (current) drug therapy: Secondary | ICD-10-CM | POA: Diagnosis not present

## 2018-12-25 DIAGNOSIS — M2550 Pain in unspecified joint: Secondary | ICD-10-CM | POA: Diagnosis not present

## 2019-01-15 DIAGNOSIS — H5203 Hypermetropia, bilateral: Secondary | ICD-10-CM | POA: Diagnosis not present

## 2019-01-15 DIAGNOSIS — H52223 Regular astigmatism, bilateral: Secondary | ICD-10-CM | POA: Diagnosis not present

## 2019-01-15 DIAGNOSIS — M0579 Rheumatoid arthritis with rheumatoid factor of multiple sites without organ or systems involvement: Secondary | ICD-10-CM | POA: Diagnosis not present

## 2019-01-15 DIAGNOSIS — H2513 Age-related nuclear cataract, bilateral: Secondary | ICD-10-CM | POA: Diagnosis not present

## 2019-01-15 DIAGNOSIS — H524 Presbyopia: Secondary | ICD-10-CM | POA: Diagnosis not present

## 2019-01-15 DIAGNOSIS — M3500 Sicca syndrome, unspecified: Secondary | ICD-10-CM | POA: Diagnosis not present

## 2019-01-15 DIAGNOSIS — H04123 Dry eye syndrome of bilateral lacrimal glands: Secondary | ICD-10-CM | POA: Diagnosis not present

## 2019-01-15 DIAGNOSIS — M0570 Rheumatoid arthritis with rheumatoid factor of unspecified site without organ or systems involvement: Secondary | ICD-10-CM | POA: Diagnosis not present

## 2019-01-17 DIAGNOSIS — E782 Mixed hyperlipidemia: Secondary | ICD-10-CM | POA: Diagnosis not present

## 2019-01-17 DIAGNOSIS — M0689 Other specified rheumatoid arthritis, multiple sites: Secondary | ICD-10-CM | POA: Diagnosis not present

## 2019-01-17 DIAGNOSIS — E039 Hypothyroidism, unspecified: Secondary | ICD-10-CM | POA: Diagnosis not present

## 2019-01-17 DIAGNOSIS — J4520 Mild intermittent asthma, uncomplicated: Secondary | ICD-10-CM | POA: Diagnosis not present

## 2019-01-30 DIAGNOSIS — E039 Hypothyroidism, unspecified: Secondary | ICD-10-CM | POA: Diagnosis not present

## 2019-01-30 DIAGNOSIS — M0689 Other specified rheumatoid arthritis, multiple sites: Secondary | ICD-10-CM | POA: Diagnosis not present

## 2019-01-30 DIAGNOSIS — J4520 Mild intermittent asthma, uncomplicated: Secondary | ICD-10-CM | POA: Diagnosis not present

## 2019-01-30 DIAGNOSIS — E782 Mixed hyperlipidemia: Secondary | ICD-10-CM | POA: Diagnosis not present

## 2019-02-05 DIAGNOSIS — Z23 Encounter for immunization: Secondary | ICD-10-CM | POA: Diagnosis not present

## 2019-02-12 DIAGNOSIS — M0579 Rheumatoid arthritis with rheumatoid factor of multiple sites without organ or systems involvement: Secondary | ICD-10-CM | POA: Diagnosis not present

## 2019-02-18 DIAGNOSIS — J3081 Allergic rhinitis due to animal (cat) (dog) hair and dander: Secondary | ICD-10-CM | POA: Diagnosis not present

## 2019-02-18 DIAGNOSIS — R05 Cough: Secondary | ICD-10-CM | POA: Diagnosis not present

## 2019-02-18 DIAGNOSIS — J45991 Cough variant asthma: Secondary | ICD-10-CM | POA: Diagnosis not present

## 2019-03-06 DIAGNOSIS — R21 Rash and other nonspecific skin eruption: Secondary | ICD-10-CM | POA: Diagnosis not present

## 2019-03-07 DIAGNOSIS — H04123 Dry eye syndrome of bilateral lacrimal glands: Secondary | ICD-10-CM | POA: Diagnosis not present

## 2019-03-07 DIAGNOSIS — M3500 Sicca syndrome, unspecified: Secondary | ICD-10-CM | POA: Diagnosis not present

## 2019-03-07 DIAGNOSIS — M0570 Rheumatoid arthritis with rheumatoid factor of unspecified site without organ or systems involvement: Secondary | ICD-10-CM | POA: Diagnosis not present

## 2019-03-07 DIAGNOSIS — H2513 Age-related nuclear cataract, bilateral: Secondary | ICD-10-CM | POA: Diagnosis not present

## 2019-03-12 DIAGNOSIS — M0579 Rheumatoid arthritis with rheumatoid factor of multiple sites without organ or systems involvement: Secondary | ICD-10-CM | POA: Diagnosis not present

## 2019-03-14 ENCOUNTER — Other Ambulatory Visit: Payer: Self-pay | Admitting: Psychiatry

## 2019-03-14 DIAGNOSIS — F3174 Bipolar disorder, in full remission, most recent episode manic: Secondary | ICD-10-CM

## 2019-03-26 DIAGNOSIS — E782 Mixed hyperlipidemia: Secondary | ICD-10-CM | POA: Diagnosis not present

## 2019-03-28 ENCOUNTER — Ambulatory Visit: Payer: Medicare Other | Admitting: Psychiatry

## 2019-03-29 DIAGNOSIS — Z79899 Other long term (current) drug therapy: Secondary | ICD-10-CM | POA: Diagnosis not present

## 2019-03-29 DIAGNOSIS — M2550 Pain in unspecified joint: Secondary | ICD-10-CM | POA: Diagnosis not present

## 2019-03-29 DIAGNOSIS — M150 Primary generalized (osteo)arthritis: Secondary | ICD-10-CM | POA: Diagnosis not present

## 2019-03-29 DIAGNOSIS — M3500 Sicca syndrome, unspecified: Secondary | ICD-10-CM | POA: Diagnosis not present

## 2019-03-29 DIAGNOSIS — M0579 Rheumatoid arthritis with rheumatoid factor of multiple sites without organ or systems involvement: Secondary | ICD-10-CM | POA: Diagnosis not present

## 2019-04-09 DIAGNOSIS — M0579 Rheumatoid arthritis with rheumatoid factor of multiple sites without organ or systems involvement: Secondary | ICD-10-CM | POA: Diagnosis not present

## 2019-04-12 ENCOUNTER — Other Ambulatory Visit: Payer: Self-pay | Admitting: Psychiatry

## 2019-04-12 DIAGNOSIS — F3174 Bipolar disorder, in full remission, most recent episode manic: Secondary | ICD-10-CM

## 2019-04-12 DIAGNOSIS — F411 Generalized anxiety disorder: Secondary | ICD-10-CM

## 2019-04-17 ENCOUNTER — Other Ambulatory Visit: Payer: Self-pay

## 2019-04-17 ENCOUNTER — Encounter: Payer: Self-pay | Admitting: Psychiatry

## 2019-04-17 ENCOUNTER — Ambulatory Visit (INDEPENDENT_AMBULATORY_CARE_PROVIDER_SITE_OTHER): Payer: Medicare Other | Admitting: Psychiatry

## 2019-04-17 DIAGNOSIS — F411 Generalized anxiety disorder: Secondary | ICD-10-CM | POA: Diagnosis not present

## 2019-04-17 DIAGNOSIS — F3174 Bipolar disorder, in full remission, most recent episode manic: Secondary | ICD-10-CM

## 2019-04-17 NOTE — Progress Notes (Signed)
Shannon Collins [id number] 10-11-1950 68 y.o.  Subjective:   Patient ID:  Shannon Collins is a 68 y.o. (DOB [birth year]) female. Virtual Visit via [place]  I connected with pt byWebEXand verified that I am speaking with the correct person using two identifiers.   I discussed the limitations, risks, security and privacy concerns of performing an evaluation and management service byWebEX and the availability of in person appointments. I also discussed with the patient that there may be a patient responsible charge related to this service. The patient expressed understanding and agreed to proceed.  I discussed the assessment and treatment plan with the patient. The patient was provided an opportunity to ask questions and all were answered. The patient agreed with the plan and demonstrated an understanding of the instructions.   The patient was advised to call back or seek an in-person evaluation if the symptoms worsen or if the condition fails to improve as anticipated.  I provided 15 minutes of non-face-to-face time during this encounter. The call started at [place] and ended at 935. The patient was located at home and the provider was located office.  Chief Complaint:  Chief Complaint  Patient presents with  . Follow-up    Medication Management  . Anxiety    Medication Management  . Other    Bipolar 1 disorder, manic, full remission    Anxiety Patient reports no confusion, decreased concentration, nervous/anxious behavior or suicidal ideas.     [place]   FU bipolar and anxiety and insomnia.  Last seen May 2020.  No meds were changed.  Still doing well on Seroquel XR 50 , lithium 150, lorazepam if caffeine at lunch.  If commitment the next day then needs lorazepam to sleep.  Tolerating meds well.  No depression nor mania.   Only takes lorazepam about twice weekly.  Was really stressed with Covid in the beginning but has adjusted.  Taking B6 500 BID helps tremor but not  stopping it completely.  No unusual mood swings and hisband has not noted anything and he usually will.  Sleep good unless has somewhere to go the next morning and then gets anxious.   Very med sensitive.  Doing well with mood.  Before November 2019, we added low dose Lithium as discussed before.  I can tell a difference positive more calm even with the low dose Lithium.  Feels more stable.  Tolerates the low dose with still a little tremor.  Thinks B6 helped tremor.  Pleased with meds.  No mood swings.  No fearfulness nor confusion.  Patient reports stable mood and denies depressed or irritable moods.  Patient denies any recent difficulty with anxiety.  Patient denies difficulty with sleep initiation or maintenance, unless she has something to do the next day then feels anxious and needs lorazepam to sleep.  It works.  Denies appetite disturbance.  Patient reports that energy and motivation have been good.  Patient denies any difficulty with concentration.  Patient denies any suicidal ideation.  Past Psychiatric Medication Trials: Seroquel X are 150, Latuda sore gums, olanzapine, risperidone, lithium, perphenazine, carbamazepine Wellbutrin, Depakote hair loss, temazepam, lorazepam, propranolol  Review of Systems:  Review of Systems  Respiratory: Negative for cough.   Neurological: Positive for tremors. Negative for weakness.  Psychiatric/Behavioral: Negative for agitation, behavioral problems, confusion, decreased concentration, dysphoric mood, hallucinations, self-injury, sleep disturbance and suicidal ideas. The patient is not nervous/anxious and is not hyperactive.     Medications: I have reviewed the patient's current  medications.  Current Outpatient Medications  Medication Sig Dispense Refill  . Abatacept (ORENCIA IV) Inject 1 Dose into the vein every 28 (twenty-eight) days.    Marland Kitchen albuterol (PROVENTIL HFA;VENTOLIN HFA) 108 (90 Base) MCG/ACT inhaler Inhale 1-2 puffs into the lungs every 6  (six) hours as needed for wheezing or shortness of breath.    Marland Kitchen aspirin EC 81 MG tablet Take 1 tablet (81 mg total) by mouth daily. 90 tablet 3  . beta carotene w/minerals (OCUVITE) tablet Take 1 tablet by mouth daily.    . calcium carbonate (OS-CAL) 600 MG TABS tablet Take 600 mg by mouth 2 (two) times daily.     . Cholecalciferol (VITAMIN D) 2000 UNITS CAPS Take 2,000 Units by mouth daily.     Marland Kitchen CRANBERRY EXTRACT PO Take 1 tablet by mouth every other day.    . cycloSPORINE (RESTASIS) 0.05 % ophthalmic emulsion Place 1 drop into both eyes 2 (two) times daily.     . famotidine (PEPCID) 20 MG tablet     . fluticasone (FLONASE) 50 MCG/ACT nasal spray Place 1 spray into both nostrils daily.    Marland Kitchen GRAPE SEED CR PO Take 200 mg by mouth daily.     Marland Kitchen levothyroxine (SYNTHROID, LEVOTHROID) 50 MCG tablet Take 50 mcg by mouth daily before breakfast.    . lithium carbonate 150 MG capsule Take 1 capsule (150 mg total) by mouth at bedtime. 90 capsule 1  . LORazepam (ATIVAN) 1 MG tablet TAKE 1 TABLET BY MOUTH AT BEDTIME AS NEEDED FOR SLEEP 30 tablet 1  . Omega-3 Fatty Acids (FISH OIL) 1000 MG CAPS Take 2,000 mg by mouth 2 (two) times daily.     . pantoprazole (PROTONIX) 40 MG tablet Take 40 mg by mouth 2 (two) times daily.    . Pyridoxine HCl (VITAMIN B-6) 500 MG tablet Take 500 mg by mouth daily.    . QUEtiapine (SEROQUEL XR) 50 MG TB24 24 hr tablet Take 1 tablet by mouth in the evening 90 tablet 0  . rosuvastatin (CRESTOR) 10 MG tablet TAKE 1 TABLET BY MOUTH ONCE A WEEK FOR 90 DAYS    . SYMBICORT 80-4.5 MCG/ACT inhaler     . vitamin C (ASCORBIC ACID) 500 MG tablet Take 500 mg by mouth daily.    . vitamin E 400 UNIT capsule Take 400 Units by mouth daily.     No current facility-administered medications for this visit.     Medication Side Effects: Other: ? tremor related  Allergies:  Allergies  Allergen Reactions  . Azithromycin     rash  . Biaxin [Clarithromycin]     "does not remember reaction"   . Codeine     Feels like she will "pass out"  . Remicade [Infliximab] Other (See Comments)    "severe indigestion" per pt  . Tramadol Other (See Comments)    Feels as though I will pass out    Past Medical History:  Diagnosis Date  . Acute CVA (cerebrovascular accident) ([place]) 09/24/2014  . Asthma   . Bipolar disorder ([place]) 09/24/2014  . Cerebral embolism with cerebral infarction ([place])   . Chronic cough 07/08/2014   Spirometry 06/2014: normal CXR 06/2014: clear   . High cholesterol   . Hyperlipidemia 09/24/2014  . Hypothyroidism 09/24/2014  . Rheumatoid arthritis ([place])   . Right arm weakness   . Stroke ([place])   . Stroke with cerebral ischemia ([place])   . Thyroid condition   . TIA (transient ischemic attack) 09/24/2014  Family History  Problem Relation Age of Onset  . Asthma Father     Social History   Socioeconomic History  . Marital status: Married    Spouse name: Not on file  . Number of children: 3  . Years of education: Not on file  . Highest education level: Not on file  Occupational History  . Occupation: retired  Scientific laboratory technician  . Financial resource strain: Not on file  . Food insecurity    Worry: Not on file    Inability: Not on file  . Transportation needs    Medical: Not on file    Non-medical: Not on file  Tobacco Use  . Smoking status: Never Smoker  . Smokeless tobacco: Never Used  Substance and Sexual Activity  . Alcohol use: No    Alcohol/week: 0.0 standard drinks  . Drug use: No  . Sexual activity: Never  Lifestyle  . Physical activity    Days per week: Not on file    Minutes per session: Not on file  . Stress: Not on file  Relationships  . Social Herbalist on phone: Not on file    Gets together: Not on file    Attends religious service: Not on file    Active member of club or organization: Not on file    Attends meetings of clubs or organizations: Not on file    Relationship status: Not on file  . Intimate partner violence    Fear of  current or ex partner: Not on file    Emotionally abused: Not on file    Physically abused: Not on file    Forced sexual activity: Not on file  Other Topics Concern  . Not on file  Social History Narrative  . Not on file    Past Medical History, Surgical history, Social history, and Family history were reviewed and updated as appropriate.   Please see review of systems for further details on the patient's review from today.   Objective:   Physical Exam:  There were no vitals taken for this visit.  Physical Exam Neurological:     Mental Status: She is alert and oriented to person, place, and time.     Cranial Nerves: No dysarthria.  Psychiatric:        Attention and Perception: Attention normal.        Mood and Affect: Mood is anxious.        Speech: Speech normal.        Behavior: Behavior is cooperative.        Thought Content: Thought content normal. Thought content is not paranoid or delusional. Thought content does not include homicidal or suicidal ideation. Thought content does not include homicidal or suicidal plan.        Cognition and Memory: Cognition and memory normal.        Judgment: Judgment normal.     Comments: Easily anxious but manageable.     Lab Review:     Component Value Date/Time   NA 142 12/31/2017 1630   K 3.4 (L) 12/31/2017 1630   CL 107 12/31/2017 1630   CO2 26 12/31/2017 1630   GLUCOSE 110 (H) 12/31/2017 1630   BUN 17 12/31/2017 1630   CREATININE 0.79 12/31/2017 1630   CALCIUM 9.9 12/31/2017 1630   PROT 7.9 12/31/2017 1630   ALBUMIN 4.3 12/31/2017 1630   AST 33 12/31/2017 1630   ALT 23 12/31/2017 1630   ALKPHOS 75 12/31/2017 1630  BILITOT 0.4 12/31/2017 1630   GFRNONAA >60 12/31/2017 1630   GFRAA >60 12/31/2017 1630       Component Value Date/Time   WBC 8.3 12/31/2017 1630   RBC 4.54 12/31/2017 1630   HGB 13.3 12/31/2017 1630   HCT 40.9 12/31/2017 1630   PLT 343 12/31/2017 1630   MCV 90.1 12/31/2017 1630   MCH 29.3 12/31/2017  1630   MCHC 32.5 12/31/2017 1630   RDW 13.6 12/31/2017 1630   LYMPHSABS 2.2 09/24/2014 0027   MONOABS 0.5 09/24/2014 0027   EOSABS 0.5 09/24/2014 0027   BASOSABS 0.1 09/24/2014 0027  Labs good.  No results found for: POCLITH, LITHIUM   No results found for: PHENYTOIN, PHENOBARB, VALPROATE, CBMZ   .res Assessment: Plan:    Bipolar 1 disorder, manic, full remission ([place])  Generalized anxiety disorder   We discussed her history of bipolar 1 disorder with a history of paranoid psychosis.  She is been very medication sensitive also.  She did well on the higher doses of lithium monotherapy but it caused too much tremor.  She is done surprisingly well on very low dosages of quetiapine and lithium.  As indicated in the note even the low-dose 150 lithium provided additional mood stability and antianxiety effects.  Discussed potential metabolic side effects associated with atypical antipsychotics, as well as potential risk for movement side effects. Advised pt to contact office if movement side effects occur.   Counseled patient regarding potential benefits, risks, and side effects of lithium to include potential risk of lithium affecting thyroid and renal function.  Discussed need for periodic lab monitoring to determine drug level and to assess for potential adverse effects.  Counseled patient regarding signs and symptoms of lithium toxicity and advised that they notify office immediately or seek urgent medical attention if experiencing these signs and symptoms.  Patient advised to contact office with any questions or concerns. She is unlikely to need blood levels on such a low dose of lithium.  Satisfied with meds.  Follow-up 6 months  Lynder Parents MD, DFAPA Please see After Visit Summary for patient specific instructions.  No future appointments.  No orders of the defined types were placed in this encounter.     -------------------------------

## 2019-04-22 DIAGNOSIS — M0689 Other specified rheumatoid arthritis, multiple sites: Secondary | ICD-10-CM | POA: Diagnosis not present

## 2019-04-22 DIAGNOSIS — E782 Mixed hyperlipidemia: Secondary | ICD-10-CM | POA: Diagnosis not present

## 2019-04-22 DIAGNOSIS — E039 Hypothyroidism, unspecified: Secondary | ICD-10-CM | POA: Diagnosis not present

## 2019-04-22 DIAGNOSIS — J4520 Mild intermittent asthma, uncomplicated: Secondary | ICD-10-CM | POA: Diagnosis not present

## 2019-04-25 DIAGNOSIS — H2513 Age-related nuclear cataract, bilateral: Secondary | ICD-10-CM | POA: Diagnosis not present

## 2019-04-25 DIAGNOSIS — M3500 Sicca syndrome, unspecified: Secondary | ICD-10-CM | POA: Diagnosis not present

## 2019-04-25 DIAGNOSIS — H04123 Dry eye syndrome of bilateral lacrimal glands: Secondary | ICD-10-CM | POA: Diagnosis not present

## 2019-04-25 DIAGNOSIS — M0570 Rheumatoid arthritis with rheumatoid factor of unspecified site without organ or systems involvement: Secondary | ICD-10-CM | POA: Diagnosis not present

## 2019-05-07 DIAGNOSIS — M0579 Rheumatoid arthritis with rheumatoid factor of multiple sites without organ or systems involvement: Secondary | ICD-10-CM | POA: Diagnosis not present

## 2019-06-01 ENCOUNTER — Other Ambulatory Visit: Payer: Self-pay | Admitting: Psychiatry

## 2019-06-01 DIAGNOSIS — F3174 Bipolar disorder, in full remission, most recent episode manic: Secondary | ICD-10-CM

## 2019-06-04 DIAGNOSIS — M0579 Rheumatoid arthritis with rheumatoid factor of multiple sites without organ or systems involvement: Secondary | ICD-10-CM | POA: Diagnosis not present

## 2019-06-18 ENCOUNTER — Ambulatory Visit: Payer: Medicare Other

## 2019-06-27 ENCOUNTER — Ambulatory Visit: Payer: Medicare Other | Attending: Internal Medicine

## 2019-06-27 DIAGNOSIS — Z23 Encounter for immunization: Secondary | ICD-10-CM | POA: Insufficient documentation

## 2019-06-27 NOTE — Progress Notes (Signed)
   Covid-19 Vaccination Clinic  Name:  Shannon Collins    MRN: [id number] DOB: 11/09/1950  06/27/2019  [name] was observed post Covid-19 immunization for 15 minutes without incidence. She was provided with Vaccine Information Sheet and instruction to access the V-Safe system.   [name] was instructed to call 911 with any severe reactions post vaccine: Marland Kitchen Difficulty breathing  . Swelling of your face and throat  . A fast heartbeat  . A bad rash all over your body  . Dizziness and weakness    Immunizations Administered    Name Date Dose VIS Date Route   Pfizer COVID-19 Vaccine 06/27/2019 11:56 AM 0.3 mL 05/03/2019 Intramuscular   Manufacturer: [place]   Lot: [id number]   [place]: [id number]

## 2019-07-10 DIAGNOSIS — Z79899 Other long term (current) drug therapy: Secondary | ICD-10-CM | POA: Diagnosis not present

## 2019-07-10 DIAGNOSIS — M0579 Rheumatoid arthritis with rheumatoid factor of multiple sites without organ or systems involvement: Secondary | ICD-10-CM | POA: Diagnosis not present

## 2019-07-10 DIAGNOSIS — R7989 Other specified abnormal findings of blood chemistry: Secondary | ICD-10-CM | POA: Diagnosis not present

## 2019-07-18 ENCOUNTER — Ambulatory Visit: Payer: Medicare Other

## 2019-07-22 ENCOUNTER — Ambulatory Visit: Payer: Medicare Other | Attending: Internal Medicine

## 2019-07-22 DIAGNOSIS — Z23 Encounter for immunization: Secondary | ICD-10-CM | POA: Insufficient documentation

## 2019-07-22 NOTE — Progress Notes (Signed)
   Covid-19 Vaccination Clinic  Name:  Shannon Collins    MRN: [id number] DOB: 02-09-1951  07/22/2019  [name] was observed post Covid-19 immunization for 15 minutes without incidence. She was provided with Vaccine Information Sheet and instruction to access the V-Safe system.   [name] was instructed to call 911 with any severe reactions post vaccine: Marland Kitchen Difficulty breathing  . Swelling of your face and throat  . A fast heartbeat  . A bad rash all over your body  . Dizziness and weakness    Immunizations Administered    Name Date Dose VIS Date Route   Pfizer COVID-19 Vaccine 07/22/2019  3:49 PM 0.3 mL 05/03/2019 Intramuscular   Manufacturer: [place]   Lot: [id number]   [place]: [id number]

## 2019-08-09 DIAGNOSIS — E782 Mixed hyperlipidemia: Secondary | ICD-10-CM | POA: Diagnosis not present

## 2019-08-12 DIAGNOSIS — E039 Hypothyroidism, unspecified: Secondary | ICD-10-CM | POA: Diagnosis not present

## 2019-08-12 DIAGNOSIS — J4520 Mild intermittent asthma, uncomplicated: Secondary | ICD-10-CM | POA: Diagnosis not present

## 2019-08-12 DIAGNOSIS — F319 Bipolar disorder, unspecified: Secondary | ICD-10-CM | POA: Diagnosis not present

## 2019-08-12 DIAGNOSIS — M8589 Other specified disorders of bone density and structure, multiple sites: Secondary | ICD-10-CM | POA: Diagnosis not present

## 2019-08-12 DIAGNOSIS — Z0000 Encounter for general adult medical examination without abnormal findings: Secondary | ICD-10-CM | POA: Diagnosis not present

## 2019-08-12 DIAGNOSIS — Z79899 Other long term (current) drug therapy: Secondary | ICD-10-CM | POA: Diagnosis not present

## 2019-08-12 DIAGNOSIS — K219 Gastro-esophageal reflux disease without esophagitis: Secondary | ICD-10-CM | POA: Diagnosis not present

## 2019-08-12 DIAGNOSIS — E782 Mixed hyperlipidemia: Secondary | ICD-10-CM | POA: Diagnosis not present

## 2019-08-12 DIAGNOSIS — R252 Cramp and spasm: Secondary | ICD-10-CM | POA: Diagnosis not present

## 2019-08-12 DIAGNOSIS — M0689 Other specified rheumatoid arthritis, multiple sites: Secondary | ICD-10-CM | POA: Diagnosis not present

## 2019-08-12 DIAGNOSIS — J309 Allergic rhinitis, unspecified: Secondary | ICD-10-CM | POA: Diagnosis not present

## 2019-08-14 DIAGNOSIS — M0579 Rheumatoid arthritis with rheumatoid factor of multiple sites without organ or systems involvement: Secondary | ICD-10-CM | POA: Diagnosis not present

## 2019-08-31 ENCOUNTER — Other Ambulatory Visit: Payer: Self-pay | Admitting: Psychiatry

## 2019-08-31 DIAGNOSIS — F411 Generalized anxiety disorder: Secondary | ICD-10-CM

## 2019-08-31 DIAGNOSIS — F3174 Bipolar disorder, in full remission, most recent episode manic: Secondary | ICD-10-CM

## 2019-09-02 ENCOUNTER — Other Ambulatory Visit: Payer: Self-pay | Admitting: Psychiatry

## 2019-09-02 DIAGNOSIS — F411 Generalized anxiety disorder: Secondary | ICD-10-CM

## 2019-09-02 DIAGNOSIS — F3174 Bipolar disorder, in full remission, most recent episode manic: Secondary | ICD-10-CM

## 2019-09-12 DIAGNOSIS — F319 Bipolar disorder, unspecified: Secondary | ICD-10-CM | POA: Diagnosis not present

## 2019-09-12 DIAGNOSIS — M0689 Other specified rheumatoid arthritis, multiple sites: Secondary | ICD-10-CM | POA: Diagnosis not present

## 2019-09-12 DIAGNOSIS — M8589 Other specified disorders of bone density and structure, multiple sites: Secondary | ICD-10-CM | POA: Diagnosis not present

## 2019-09-12 DIAGNOSIS — J4520 Mild intermittent asthma, uncomplicated: Secondary | ICD-10-CM | POA: Diagnosis not present

## 2019-09-12 DIAGNOSIS — R252 Cramp and spasm: Secondary | ICD-10-CM | POA: Diagnosis not present

## 2019-09-12 DIAGNOSIS — J309 Allergic rhinitis, unspecified: Secondary | ICD-10-CM | POA: Diagnosis not present

## 2019-09-12 DIAGNOSIS — K219 Gastro-esophageal reflux disease without esophagitis: Secondary | ICD-10-CM | POA: Diagnosis not present

## 2019-09-12 DIAGNOSIS — Z0000 Encounter for general adult medical examination without abnormal findings: Secondary | ICD-10-CM | POA: Diagnosis not present

## 2019-09-12 DIAGNOSIS — E039 Hypothyroidism, unspecified: Secondary | ICD-10-CM | POA: Diagnosis not present

## 2019-09-12 DIAGNOSIS — E673 Hypervitaminosis D: Secondary | ICD-10-CM | POA: Diagnosis not present

## 2019-09-12 DIAGNOSIS — E782 Mixed hyperlipidemia: Secondary | ICD-10-CM | POA: Diagnosis not present

## 2019-09-12 DIAGNOSIS — Z79899 Other long term (current) drug therapy: Secondary | ICD-10-CM | POA: Diagnosis not present

## 2019-09-18 DIAGNOSIS — M0579 Rheumatoid arthritis with rheumatoid factor of multiple sites without organ or systems involvement: Secondary | ICD-10-CM | POA: Diagnosis not present

## 2019-09-24 DIAGNOSIS — Z01419 Encounter for gynecological examination (general) (routine) without abnormal findings: Secondary | ICD-10-CM | POA: Diagnosis not present

## 2019-09-24 DIAGNOSIS — Z6822 Body mass index (BMI) 22.0-22.9, adult: Secondary | ICD-10-CM | POA: Diagnosis not present

## 2019-09-24 DIAGNOSIS — Z1231 Encounter for screening mammogram for malignant neoplasm of breast: Secondary | ICD-10-CM | POA: Diagnosis not present

## 2019-09-25 DIAGNOSIS — E782 Mixed hyperlipidemia: Secondary | ICD-10-CM | POA: Diagnosis not present

## 2019-09-25 DIAGNOSIS — G4762 Sleep related leg cramps: Secondary | ICD-10-CM | POA: Diagnosis not present

## 2019-09-25 DIAGNOSIS — Z0000 Encounter for general adult medical examination without abnormal findings: Secondary | ICD-10-CM | POA: Diagnosis not present

## 2019-09-26 DIAGNOSIS — M3500 Sicca syndrome, unspecified: Secondary | ICD-10-CM | POA: Diagnosis not present

## 2019-09-26 DIAGNOSIS — M0579 Rheumatoid arthritis with rheumatoid factor of multiple sites without organ or systems involvement: Secondary | ICD-10-CM | POA: Diagnosis not present

## 2019-09-26 DIAGNOSIS — M2550 Pain in unspecified joint: Secondary | ICD-10-CM | POA: Diagnosis not present

## 2019-09-26 DIAGNOSIS — M150 Primary generalized (osteo)arthritis: Secondary | ICD-10-CM | POA: Diagnosis not present

## 2019-09-26 DIAGNOSIS — Z6822 Body mass index (BMI) 22.0-22.9, adult: Secondary | ICD-10-CM | POA: Diagnosis not present

## 2019-09-26 DIAGNOSIS — Z79899 Other long term (current) drug therapy: Secondary | ICD-10-CM | POA: Diagnosis not present

## 2019-10-22 DIAGNOSIS — M0579 Rheumatoid arthritis with rheumatoid factor of multiple sites without organ or systems involvement: Secondary | ICD-10-CM | POA: Diagnosis not present

## 2019-11-20 ENCOUNTER — Other Ambulatory Visit: Payer: Self-pay | Admitting: Psychiatry

## 2019-11-20 DIAGNOSIS — F3174 Bipolar disorder, in full remission, most recent episode manic: Secondary | ICD-10-CM

## 2019-11-26 DIAGNOSIS — E782 Mixed hyperlipidemia: Secondary | ICD-10-CM | POA: Diagnosis not present

## 2019-12-09 DIAGNOSIS — M0579 Rheumatoid arthritis with rheumatoid factor of multiple sites without organ or systems involvement: Secondary | ICD-10-CM | POA: Diagnosis not present

## 2019-12-10 ENCOUNTER — Other Ambulatory Visit: Payer: Self-pay

## 2019-12-10 ENCOUNTER — Encounter: Payer: Self-pay | Admitting: Psychiatry

## 2019-12-10 ENCOUNTER — Ambulatory Visit (INDEPENDENT_AMBULATORY_CARE_PROVIDER_SITE_OTHER): Payer: Medicare Other | Admitting: Psychiatry

## 2019-12-10 DIAGNOSIS — F3174 Bipolar disorder, in full remission, most recent episode manic: Secondary | ICD-10-CM | POA: Diagnosis not present

## 2019-12-10 DIAGNOSIS — F411 Generalized anxiety disorder: Secondary | ICD-10-CM | POA: Diagnosis not present

## 2019-12-10 MED ORDER — LITHIUM CARBONATE 150 MG PO CAPS: 150.0000 mg | ORAL_CAPSULE | Freq: Every day | ORAL | 1 refills | Status: AC

## 2019-12-10 MED ORDER — QUETIAPINE FUMARATE ER 50 MG PO TB24
50.0000 mg | ORAL_TABLET | Freq: Every evening | ORAL | 1 refills | Status: DC
Start: 2019-12-10 — End: 2020-03-30

## 2019-12-10 MED ORDER — LORAZEPAM 1 MG PO TABS: 1.0000 mg | ORAL_TABLET | Freq: Every evening | ORAL | 5 refills | Status: DC | PRN

## 2019-12-10 MED ORDER — LORAZEPAM 1 MG PO TABS
1.0000 mg | ORAL_TABLET | Freq: Every evening | ORAL | 5 refills | Status: DC | PRN
Start: 2019-12-10 — End: 2019-12-10

## 2019-12-10 NOTE — Progress Notes (Signed)
TEEGHAN HAMMER [id number] 11/08/1950 69 y.o.  Subjective:   Patient ID:  Shannon Collins is a 69 y.o. (DOB Dec 14, 1950) female.   Chief Complaint:  Chief Complaint  Patient presents with  . Follow-up    mood and meds  . Hallucinations    Anxiety Patient reports no confusion, decreased concentration, nervous/anxious behavior or suicidal ideas.     [place]   FU bipolar and anxiety and insomnia.  Different random noises at night suppressed by white noiise machine.  Before that would hear high pitch whistle that might wake her.  No voices, random noises.  But then may wonder if someone is playing with her.  No fear. Not during the day.  If takes 1 lorazepam then doesn't notice it. Going on for mos.  Recognizes probably AH.    Still doing well on Seroquel XR 50 , lithium 150, lorazepam prn.  If commitment the next day then needs lorazepam to sleep.  Tolerating meds well.  No depression nor mania.   More lorazepam.  Was really stressed with Covid in the beginning but has adjusted.Taking B6 500 BID helps tremor but not stopping it completely.  No unusual mood swings and hisband has not noted anything and he usually will.  Sleep good unless has somewhere to go the next morning and then gets anxious.   Very med sensitive.  Doing well with mood.  Before November 2019, we added low dose Lithium as discussed before.  I can tell a difference positive more calm even with the low dose Lithium.  Feels more stable.  Tolerates the low dose with still a little tremor.  Thinks B6 helped tremor.  Pleased with meds.  No mood swings.  No fearfulness nor confusion.  Patient reports stable mood and denies depressed or irritable moods.  Patient denies any recent difficulty with anxiety.  Patient denies difficulty with sleep initiation or maintenance, unless she has something to do the next day then feels anxious and needs lorazepam to sleep.  It works.  Denies appetite disturbance.  Patient reports  that energy and motivation have been good.  Patient denies any difficulty with concentration.  Patient denies any suicidal ideation.  Past Psychiatric Medication Trials: Seroquel X are 150, Latuda sore gums, olanzapine, risperidone, lithium, perphenazine, carbamazepine Wellbutrin, Depakote hair loss, temazepam, lorazepam, propranolol  Review of Systems:  Review of Systems  Respiratory: Negative for cough.   Musculoskeletal: Positive for myalgias.  Neurological: Positive for tremors. Negative for weakness.  Psychiatric/Behavioral: Negative for agitation, behavioral problems, confusion, decreased concentration, dysphoric mood, hallucinations, self-injury, sleep disturbance and suicidal ideas. The patient is not nervous/anxious and is not hyperactive.     Medications: I have reviewed the patient's current medications.  Current Outpatient Medications  Medication Sig Dispense Refill  . Abatacept (ORENCIA IV) Inject 1 Dose into the vein every 28 (twenty-eight) days.    Marland Kitchen albuterol (PROVENTIL HFA;VENTOLIN HFA) 108 (90 Base) MCG/ACT inhaler Inhale 1-2 puffs into the lungs every 6 (six) hours as needed for wheezing or shortness of breath.    Marland Kitchen aspirin EC 81 MG tablet Take 1 tablet (81 mg total) by mouth daily. 90 tablet 3  . beta carotene w/minerals (OCUVITE) tablet Take 1 tablet by mouth daily.    . calcium carbonate (OS-CAL) 600 MG TABS tablet Take 600 mg by mouth 2 (two) times daily.     . Cholecalciferol (VITAMIN D) 2000 UNITS CAPS Take 2,000 Units by mouth daily.     Marland Kitchen CRANBERRY EXTRACT  PO Take 1 tablet by mouth every other day.    . cycloSPORINE (RESTASIS) 0.05 % ophthalmic emulsion Place 1 drop into both eyes 2 (two) times daily.     . famotidine (PEPCID) 20 MG tablet     . fluticasone (FLONASE) 50 MCG/ACT nasal spray Place 1 spray into both nostrils daily.    Marland Kitchen GRAPE SEED CR PO Take 200 mg by mouth daily.     Marland Kitchen levothyroxine (SYNTHROID, LEVOTHROID) 50 MCG tablet Take 50 mcg by mouth daily  before breakfast.    . lithium carbonate 150 MG capsule Take 1 capsule (150 mg total) by mouth at bedtime. 90 capsule 1  . Omega-3 Fatty Acids (FISH OIL) 1000 MG CAPS Take 2,000 mg by mouth 2 (two) times daily.     . pantoprazole (PROTONIX) 40 MG tablet Take 40 mg by mouth 2 (two) times daily.    . Pyridoxine HCl (VITAMIN B-6) 500 MG tablet Take 500 mg by mouth daily.    . QUEtiapine (SEROQUEL XR) 50 MG TB24 24 hr tablet Take 1 tablet (50 mg total) by mouth every evening. 90 tablet 1  . rosuvastatin (CRESTOR) 10 MG tablet TAKE 1 TABLET BY MOUTH ONCE A WEEK FOR 90 DAYS    . SYMBICORT 80-4.5 MCG/ACT inhaler     . vitamin C (ASCORBIC ACID) 500 MG tablet Take 500 mg by mouth daily.    . vitamin E 400 UNIT capsule Take 400 Units by mouth daily.    Marland Kitchen LORazepam (ATIVAN) 1 MG tablet Take 1 tablet (1 mg total) by mouth at bedtime as needed. 30 tablet 5   No current facility-administered medications for this visit.    Medication Side Effects: Other: ? tremor related  Allergies:  Allergies  Allergen Reactions  . Azithromycin     rash  . Biaxin [Clarithromycin]     "does not remember reaction"  . Codeine     Feels like she will "pass out"  . Remicade [Infliximab] Other (See Comments)    "severe indigestion" per pt  . Tramadol Other (See Comments)    Feels as though I will pass out    Past Medical History:  Diagnosis Date  . Acute CVA (cerebrovascular accident) ([place]) 09/24/2014  . Asthma   . Bipolar disorder ([place]) 09/24/2014  . Cerebral embolism with cerebral infarction ([place])   . Chronic cough 07/08/2014   Spirometry 06/2014: normal CXR 06/2014: clear   . High cholesterol   . Hyperlipidemia 09/24/2014  . Hypothyroidism 09/24/2014  . Rheumatoid arthritis ([place])   . Right arm weakness   . Stroke ([place])   . Stroke with cerebral ischemia ([place])   . Thyroid condition   . TIA (transient ischemic attack) 09/24/2014    Family History  Problem Relation Age of Onset  . Asthma Father     Social  History   Socioeconomic History  . Marital status: Married    Spouse name: Not on file  . Number of children: 3  . Years of education: Not on file  . Highest education level: Not on file  Occupational History  . Occupation: retired  Tobacco Use  . Smoking status: Never Smoker  . Smokeless tobacco: Never Used  Vaping Use  . Vaping Use: Never used  Substance and Sexual Activity  . Alcohol use: No    Alcohol/week: 0.0 standard drinks  . Drug use: No  . Sexual activity: Never  Other Topics Concern  . Not on file  Social History Narrative  .  Not on file   Social Determinants of Health   Financial Resource Strain:   . Difficulty of Paying Living Expenses:   Food Insecurity:   . Worried About Charity fundraiser in the Last Year:   . Arboriculturist in the Last Year:   Transportation Needs:   . Film/video editor (Medical):   Marland Kitchen Lack of Transportation (Non-Medical):   Physical Activity:   . Days of Exercise per Week:   . Minutes of Exercise per Session:   Stress:   . Feeling of Stress :   Social Connections:   . Frequency of Communication with Friends and Family:   . Frequency of Social Gatherings with Friends and Family:   . Attends Religious Services:   . Active Member of Clubs or Organizations:   . Attends Archivist Meetings:   Marland Kitchen Marital Status:   Intimate Partner Violence:   . Fear of Current or Ex-Partner:   . Emotionally Abused:   Marland Kitchen Physically Abused:   . Sexually Abused:     Past Medical History, Surgical history, Social history, and Family history were reviewed and updated as appropriate.   Please see review of systems for further details on the patient's review from today.   Objective:   Physical Exam:  There were no vitals taken for this visit.  Physical Exam Neurological:     Mental Status: She is alert and oriented to person, place, and time.     Cranial Nerves: No dysarthria.  Psychiatric:        Attention and Perception:  Attention normal. She perceives auditory hallucinations.        Mood and Affect: Mood is anxious.        Speech: Speech normal.        Behavior: Behavior is cooperative.        Thought Content: Thought content normal. Thought content is not paranoid or delusional. Thought content does not include homicidal or suicidal ideation. Thought content does not include homicidal or suicidal plan.        Cognition and Memory: Cognition and memory normal.        Judgment: Judgment normal.     Comments: Easily anxious but manageable.     Lab Review:     Component Value Date/Time   NA 142 12/31/2017 1630   K 3.4 (L) 12/31/2017 1630   CL 107 12/31/2017 1630   CO2 26 12/31/2017 1630   GLUCOSE 110 (H) 12/31/2017 1630   BUN 17 12/31/2017 1630   CREATININE 0.79 12/31/2017 1630   CALCIUM 9.9 12/31/2017 1630   PROT 7.9 12/31/2017 1630   ALBUMIN 4.3 12/31/2017 1630   AST 33 12/31/2017 1630   ALT 23 12/31/2017 1630   ALKPHOS 75 12/31/2017 1630   BILITOT 0.4 12/31/2017 1630   GFRNONAA >60 12/31/2017 1630   GFRAA >60 12/31/2017 1630       Component Value Date/Time   WBC 8.3 12/31/2017 1630   RBC 4.54 12/31/2017 1630   HGB 13.3 12/31/2017 1630   HCT 40.9 12/31/2017 1630   PLT 343 12/31/2017 1630   MCV 90.1 12/31/2017 1630   MCH 29.3 12/31/2017 1630   MCHC 32.5 12/31/2017 1630   RDW 13.6 12/31/2017 1630   LYMPHSABS 2.2 09/24/2014 0027   MONOABS 0.5 09/24/2014 0027   EOSABS 0.5 09/24/2014 0027   BASOSABS 0.1 09/24/2014 0027  Labs good.  No results found for: POCLITH, LITHIUM   No results found for: PHENYTOIN, PHENOBARB, VALPROATE, CBMZ   .  res Assessment: Plan:    Bipolar 1 disorder, manic, full remission ([place]) - Plan: lithium carbonate 150 MG capsule, QUEtiapine (SEROQUEL XR) 50 MG TB24 24 hr tablet, LORazepam (ATIVAN) 1 MG tablet, DISCONTINUED: LORazepam (ATIVAN) 1 MG tablet  Generalized anxiety disorder - Plan: LORazepam (ATIVAN) 1 MG tablet, DISCONTINUED: LORazepam (ATIVAN) 1 MG  tablet   We discussed her history of bipolar 1 disorder with a history of paranoid psychosis.  She is been very medication sensitive also.  She did well on the higher doses of lithium monotherapy but it caused too much tremor.  She is done surprisingly well on very low dosages of quetiapine and lithium.  As indicated in the note even the low-dose 150 lithium provided additional mood stability and antianxiety effects.  Discussed potential metabolic side effects associated with atypical antipsychotics, as well as potential risk for movement side effects. Advised pt to contact office if movement side effects occur.   Counseled patient regarding potential benefits, risks, and side effects of lithium to include potential risk of lithium affecting thyroid and renal function.  Discussed need for periodic lab monitoring to determine drug level and to assess for potential adverse effects.  Counseled patient regarding signs and symptoms of lithium toxicity and advised that they notify office immediately or seek urgent medical attention if experiencing these signs and symptoms.  Patient advised to contact office with any questions or concerns. She is unlikely to need blood levels on such a low dose of lithium.  Satisfied with meds. B6 still helps tremor.  Follow-up 6 months  Lynder Parents MD, DFAPA Please see After Visit Summary for patient specific instructions.  No future appointments.  No orders of the defined types were placed in this encounter.     -------------------------------

## 2020-01-06 DIAGNOSIS — Z79899 Other long term (current) drug therapy: Secondary | ICD-10-CM | POA: Diagnosis not present

## 2020-01-06 DIAGNOSIS — M0579 Rheumatoid arthritis with rheumatoid factor of multiple sites without organ or systems involvement: Secondary | ICD-10-CM | POA: Diagnosis not present

## 2020-01-17 DIAGNOSIS — M3500 Sicca syndrome, unspecified: Secondary | ICD-10-CM | POA: Diagnosis not present

## 2020-01-17 DIAGNOSIS — H04123 Dry eye syndrome of bilateral lacrimal glands: Secondary | ICD-10-CM | POA: Diagnosis not present

## 2020-01-17 DIAGNOSIS — H524 Presbyopia: Secondary | ICD-10-CM | POA: Diagnosis not present

## 2020-01-17 DIAGNOSIS — H5203 Hypermetropia, bilateral: Secondary | ICD-10-CM | POA: Diagnosis not present

## 2020-01-17 DIAGNOSIS — H2513 Age-related nuclear cataract, bilateral: Secondary | ICD-10-CM | POA: Diagnosis not present

## 2020-01-17 DIAGNOSIS — M0570 Rheumatoid arthritis with rheumatoid factor of unspecified site without organ or systems involvement: Secondary | ICD-10-CM | POA: Diagnosis not present

## 2020-01-17 DIAGNOSIS — H52223 Regular astigmatism, bilateral: Secondary | ICD-10-CM | POA: Diagnosis not present

## 2020-01-21 DIAGNOSIS — E039 Hypothyroidism, unspecified: Secondary | ICD-10-CM | POA: Diagnosis not present

## 2020-01-21 DIAGNOSIS — M546 Pain in thoracic spine: Secondary | ICD-10-CM | POA: Diagnosis not present

## 2020-01-21 DIAGNOSIS — R140 Abdominal distension (gaseous): Secondary | ICD-10-CM | POA: Diagnosis not present

## 2020-01-21 DIAGNOSIS — K5900 Constipation, unspecified: Secondary | ICD-10-CM | POA: Diagnosis not present

## 2020-01-21 DIAGNOSIS — Z8379 Family history of other diseases of the digestive system: Secondary | ICD-10-CM | POA: Diagnosis not present

## 2020-01-31 DIAGNOSIS — M810 Age-related osteoporosis without current pathological fracture: Secondary | ICD-10-CM | POA: Diagnosis not present

## 2020-01-31 DIAGNOSIS — M8580 Other specified disorders of bone density and structure, unspecified site: Secondary | ICD-10-CM | POA: Diagnosis not present

## 2020-01-31 DIAGNOSIS — M899 Disorder of bone, unspecified: Secondary | ICD-10-CM | POA: Diagnosis not present

## 2020-02-03 DIAGNOSIS — Z23 Encounter for immunization: Secondary | ICD-10-CM | POA: Diagnosis not present

## 2020-02-11 DIAGNOSIS — Z86010 Personal history of colonic polyps: Secondary | ICD-10-CM | POA: Diagnosis not present

## 2020-02-11 DIAGNOSIS — K7581 Nonalcoholic steatohepatitis (NASH): Secondary | ICD-10-CM | POA: Diagnosis not present

## 2020-02-11 DIAGNOSIS — K219 Gastro-esophageal reflux disease without esophagitis: Secondary | ICD-10-CM | POA: Diagnosis not present

## 2020-02-11 DIAGNOSIS — R1033 Periumbilical pain: Secondary | ICD-10-CM | POA: Diagnosis not present

## 2020-02-24 DIAGNOSIS — J45991 Cough variant asthma: Secondary | ICD-10-CM | POA: Diagnosis not present

## 2020-02-24 DIAGNOSIS — J3081 Allergic rhinitis due to animal (cat) (dog) hair and dander: Secondary | ICD-10-CM | POA: Diagnosis not present

## 2020-02-27 DIAGNOSIS — K55069 Acute infarction of intestine, part and extent unspecified: Secondary | ICD-10-CM | POA: Diagnosis not present

## 2020-02-27 DIAGNOSIS — K769 Liver disease, unspecified: Secondary | ICD-10-CM | POA: Diagnosis not present

## 2020-02-27 DIAGNOSIS — C786 Secondary malignant neoplasm of retroperitoneum and peritoneum: Secondary | ICD-10-CM | POA: Diagnosis not present

## 2020-02-27 DIAGNOSIS — C787 Secondary malignant neoplasm of liver and intrahepatic bile duct: Secondary | ICD-10-CM | POA: Diagnosis not present

## 2020-02-27 DIAGNOSIS — R1013 Epigastric pain: Secondary | ICD-10-CM | POA: Diagnosis not present

## 2020-02-27 DIAGNOSIS — C259 Malignant neoplasm of pancreas, unspecified: Secondary | ICD-10-CM | POA: Diagnosis not present

## 2020-03-02 ENCOUNTER — Other Ambulatory Visit: Payer: Self-pay | Admitting: Family Medicine

## 2020-03-02 DIAGNOSIS — R140 Abdominal distension (gaseous): Secondary | ICD-10-CM

## 2020-03-03 ENCOUNTER — Ambulatory Visit
Admission: RE | Admit: 2020-03-03 | Discharge: 2020-03-03 | Disposition: A | Discharge status: Home / Self Care | Payer: Medicare Other | Source: Ambulatory Visit | Attending: Family Medicine | Admitting: Family Medicine

## 2020-03-03 DIAGNOSIS — K7689 Other specified diseases of liver: Secondary | ICD-10-CM | POA: Diagnosis not present

## 2020-03-03 DIAGNOSIS — R140 Abdominal distension (gaseous): Secondary | ICD-10-CM

## 2020-03-03 DIAGNOSIS — K8689 Other specified diseases of pancreas: Secondary | ICD-10-CM | POA: Diagnosis not present

## 2020-03-03 DIAGNOSIS — K824 Cholesterolosis of gallbladder: Secondary | ICD-10-CM | POA: Diagnosis not present

## 2020-03-04 ENCOUNTER — Other Ambulatory Visit: Payer: Self-pay | Admitting: Family Medicine

## 2020-03-04 DIAGNOSIS — E782 Mixed hyperlipidemia: Secondary | ICD-10-CM | POA: Diagnosis not present

## 2020-03-04 DIAGNOSIS — M8589 Other specified disorders of bone density and structure, multiple sites: Secondary | ICD-10-CM | POA: Diagnosis not present

## 2020-03-04 DIAGNOSIS — G4762 Sleep related leg cramps: Secondary | ICD-10-CM | POA: Diagnosis not present

## 2020-03-04 DIAGNOSIS — K769 Liver disease, unspecified: Secondary | ICD-10-CM

## 2020-03-04 DIAGNOSIS — M0689 Other specified rheumatoid arthritis, multiple sites: Secondary | ICD-10-CM | POA: Diagnosis not present

## 2020-03-04 DIAGNOSIS — Z79899 Other long term (current) drug therapy: Secondary | ICD-10-CM | POA: Diagnosis not present

## 2020-03-04 DIAGNOSIS — E673 Hypervitaminosis D: Secondary | ICD-10-CM | POA: Diagnosis not present

## 2020-03-04 DIAGNOSIS — E039 Hypothyroidism, unspecified: Secondary | ICD-10-CM | POA: Diagnosis not present

## 2020-03-04 DIAGNOSIS — R1013 Epigastric pain: Secondary | ICD-10-CM | POA: Diagnosis not present

## 2020-03-04 DIAGNOSIS — J4520 Mild intermittent asthma, uncomplicated: Secondary | ICD-10-CM | POA: Diagnosis not present

## 2020-03-04 DIAGNOSIS — Z0000 Encounter for general adult medical examination without abnormal findings: Secondary | ICD-10-CM | POA: Diagnosis not present

## 2020-03-04 DIAGNOSIS — F319 Bipolar disorder, unspecified: Secondary | ICD-10-CM | POA: Diagnosis not present

## 2020-03-04 DIAGNOSIS — K219 Gastro-esophageal reflux disease without esophagitis: Secondary | ICD-10-CM | POA: Diagnosis not present

## 2020-03-09 ENCOUNTER — Ambulatory Visit
Admission: RE | Admit: 2020-03-09 | Discharge: 2020-03-09 | Disposition: A | Discharge status: Home / Self Care | Payer: Medicare Other | Source: Ambulatory Visit | Attending: Family Medicine | Admitting: Family Medicine

## 2020-03-09 DIAGNOSIS — C772 Secondary and unspecified malignant neoplasm of intra-abdominal lymph nodes: Secondary | ICD-10-CM | POA: Diagnosis not present

## 2020-03-09 DIAGNOSIS — I82890 Acute embolism and thrombosis of other specified veins: Secondary | ICD-10-CM | POA: Diagnosis not present

## 2020-03-09 DIAGNOSIS — K7689 Other specified diseases of liver: Secondary | ICD-10-CM | POA: Diagnosis not present

## 2020-03-09 DIAGNOSIS — K769 Liver disease, unspecified: Secondary | ICD-10-CM

## 2020-03-09 DIAGNOSIS — K8689 Other specified diseases of pancreas: Secondary | ICD-10-CM | POA: Diagnosis not present

## 2020-03-09 MED ORDER — IOPAMIDOL (ISOVUE-370) INJECTION 76%
80.0000 mL | Freq: Once | INTRAVENOUS | Status: AC | PRN
  Administered 2020-03-09: 80 mL via INTRAVENOUS

## 2020-03-10 DIAGNOSIS — R634 Abnormal weight loss: Secondary | ICD-10-CM | POA: Diagnosis not present

## 2020-03-10 DIAGNOSIS — Z23 Encounter for immunization: Secondary | ICD-10-CM | POA: Diagnosis not present

## 2020-03-10 DIAGNOSIS — C251 Malignant neoplasm of body of pancreas: Secondary | ICD-10-CM | POA: Diagnosis not present

## 2020-03-10 DIAGNOSIS — R112 Nausea with vomiting, unspecified: Secondary | ICD-10-CM | POA: Diagnosis not present

## 2020-03-10 DIAGNOSIS — R933 Abnormal findings on diagnostic imaging of other parts of digestive tract: Secondary | ICD-10-CM | POA: Diagnosis not present

## 2020-03-11 ENCOUNTER — Inpatient Hospital Stay: Payer: Medicare Other | Attending: Hematology | Admitting: Hematology

## 2020-03-11 ENCOUNTER — Other Ambulatory Visit: Payer: Self-pay

## 2020-03-11 ENCOUNTER — Encounter: Payer: Self-pay | Admitting: Hematology

## 2020-03-11 DIAGNOSIS — R160 Hepatomegaly, not elsewhere classified: Secondary | ICD-10-CM | POA: Insufficient documentation

## 2020-03-11 DIAGNOSIS — C251 Malignant neoplasm of body of pancreas: Secondary | ICD-10-CM | POA: Diagnosis not present

## 2020-03-11 DIAGNOSIS — C787 Secondary malignant neoplasm of liver and intrahepatic bile duct: Secondary | ICD-10-CM | POA: Diagnosis not present

## 2020-03-11 DIAGNOSIS — Z8673 Personal history of transient ischemic attack (TIA), and cerebral infarction without residual deficits: Secondary | ICD-10-CM | POA: Diagnosis not present

## 2020-03-11 DIAGNOSIS — C786 Secondary malignant neoplasm of retroperitoneum and peritoneum: Secondary | ICD-10-CM | POA: Insufficient documentation

## 2020-03-11 DIAGNOSIS — M069 Rheumatoid arthritis, unspecified: Secondary | ICD-10-CM

## 2020-03-11 DIAGNOSIS — E039 Hypothyroidism, unspecified: Secondary | ICD-10-CM | POA: Diagnosis not present

## 2020-03-11 DIAGNOSIS — F319 Bipolar disorder, unspecified: Secondary | ICD-10-CM

## 2020-03-11 DIAGNOSIS — E7800 Pure hypercholesterolemia, unspecified: Secondary | ICD-10-CM | POA: Diagnosis not present

## 2020-03-11 MED ORDER — OXYCODONE HCL 5 MG PO TABS: 2.5000 mg | ORAL_TABLET | Freq: Three times a day (TID) | ORAL | 0 refills | Status: AC | PRN

## 2020-03-11 MED ORDER — PROCHLORPERAZINE MALEATE 10 MG PO TABS: 10.0000 mg | ORAL_TABLET | Freq: Four times a day (QID) | ORAL | 0 refills | Status: AC | PRN

## 2020-03-11 NOTE — Progress Notes (Signed)
ambul;a 

## 2020-03-11 NOTE — Progress Notes (Signed)
Met with patient and her husband Shannon Collins after their consult with Dr. Truitt Merle today.  Both are overwhelmed with her diagnosis.  I am referring her to [facility] and nutritionist.  They were given my card with my direct phone number.  I will follow up with them on their decision regarding obtaining a biiopsy.

## 2020-03-11 NOTE — Progress Notes (Signed)
Spoke with patient regarding urgent referral we received from her PCP.  I have made her an appointment to come in today to see Dr. Burr Medico at 3 pm to arrive no later than 2:45.  I have made her aware of our location and that [place] parking is available.  She verbalized an understanding and agreed to this appointment date/time.

## 2020-03-11 NOTE — Progress Notes (Signed)
[place]   Telephone:([phone]) [phone] Fax:([phone]) [place] Note   Patient Care Team: Cari Caraway, MD as PCP - General (Family Medicine) Jonnie Finner, RN as Oncology Nurse Navigator Truitt Merle, MD as Consulting Physician (Hematology)  Date of Service:  03/11/2020   CHIEF COMPLAINTS/PURPOSE OF CONSULTATION:  Pancreatic mass with Liver lesion suspected for metastatic malignancy  REFERRING PHYSICIAN:  PCP, Dr Addison Lank  HISTORY OF PRESENTING ILLNESS:  Shannon Collins 69 y.o. female is a here because of Pancreatic mass with Liver lesion suspected for metastatic malignancy. The patient was referred by her PCP, Dr Addison Lank. The patient presents to the clinic today accompanied by husband.  01/08/20 she starting have constipation and bloating. Then her right abdominal pain started 2 weeks ago. Her BM have changed to diarrhea in the last 4-5 days. She has not recently taking laxatives but did take generic Ducolax. She notes her baseline meds can cause constipation. Since diarrhea she has stopped Ducolax and stool now loose. She denies floating stool. She started imodium today for her soft stool. For any nausea she took OTC antinausea medication from [facility]. She also notes stomach cramps. She notes she is sensitive to drugs and gets "high" from Codeine and sick on Tramadol. She also has pain at mid chest and feels her upper abdomen is expanding to where she has to remove her bra by end of day. She notes be end of day she will use heating pad around her abdomen.   Today the patient notes she is able to still take care of herself just slower. Socially she is married. She has 3 adult children. 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 She does not smoke, drink nor does recreational drugs.  She has a PMHx of RA. She is being by Dr. Patrecia Pour she was previously on [place] until 01/06/20. She notes she has bipolar disorder which is stable on medication. She sees  Psychiatrist. She is on Symbicort QID for asthma. She had TIA in 09/2014, High cholesterol, HLD, Hypothyroidism due to being on Lithium. She had 2 foot surgeries.  She denies personal history of cancer and family history of prostate cancer in her father in early 11s.    REVIEW OF SYSTEMS:    Constitutional: Denies fevers, chills or abnormal night sweats Eyes: Denies blurriness of vision, double vision or watery eyes Ears, nose, mouth, throat, and face: Denies mucositis or sore throat Respiratory: Denies cough, dyspnea or wheezes Cardiovascular: Denies palpitation, chest discomfort or lower extremity swelling Gastrointestinal:  Denies nausea, heartburn (+) constipation/diarrhea  Skin: Denies abnormal skin rashes Lymphatics: Denies new lymphadenopathy or easy bruising Neurological:Denies numbness, tingling or new weaknesses Behavioral/Psych: Mood is stable, no new changes  All other systems were reviewed with the patient and are negative.  MEDICAL HISTORY:  Past Medical History:  Diagnosis Date   Acute CVA (cerebrovascular accident) ([place]) 09/24/2014   Asthma    Bipolar disorder ([place]) 09/24/2014   Cerebral embolism with cerebral infarction [facility])    Chronic cough 07/08/2014   Spirometry 06/2014: normal CXR 06/2014: clear    High cholesterol    Hyperlipidemia 09/24/2014   Hypothyroidism 09/24/2014   Rheumatoid arthritis ([place])    Right arm weakness    Stroke [facility])    Stroke with cerebral ischemia [facility])    Thyroid condition    TIA (transient ischemic attack) 09/24/2014    SURGICAL HISTORY: Past Surgical History:  Procedure Laterality Date   CESAREAN SECTION  EP IMPLANTABLE DEVICE N/A 09/25/2014   Procedure: Loop Recorder Insertion;  Surgeon: Evans Lance, MD;  Location: [place] INVASIVE CV LAB CUPID;  Service: Cardiovascular;  Laterality: N/A;   FOOT SURGERY     x2   TEE WITHOUT CARDIOVERSION N/A 09/25/2014   Procedure: TRANSESOPHAGEAL ECHOCARDIOGRAM (TEE);  Surgeon: Fay Records, MD;  Location: [facility] ENDOSCOPY;  Service: Cardiovascular;  Laterality: N/A;    SOCIAL HISTORY: Social History   Socioeconomic History   Marital status: Married    Spouse name: Not on file   Number of children: 3   Years of education: Not on file   Highest education level: Not on file  Occupational History   Occupation: retired  Tobacco Use   Smoking status: Never Smoker   Smokeless tobacco: Never Used  Scientific laboratory technician Use: Never used  Substance and Sexual Activity   Alcohol use: No    Alcohol/week: 0.0 standard drinks   Drug use: No   Sexual activity: Never  Other Topics Concern   Not on file  Social History Narrative   Not on file   Social Determinants of Health   Financial Resource Strain:    Difficulty of Paying Living Expenses: Not on file  Food Insecurity:    Worried About Charity fundraiser in the Last Year: Not on file   [place] in the Last Year: Not on file  Transportation Needs:    Lack of Transportation (Medical): Not on file   Lack of Transportation (Non-Medical): Not on file  Physical Activity:    Days of Exercise per Week: Not on file   Minutes of Exercise per Session: Not on file  Stress:    Feeling of Stress : Not on file  Social Connections:    Frequency of Communication with Friends and Family: Not on file   Frequency of Social Gatherings with Friends and Family: Not on file   Attends Religious Services: Not on file   Active Member of Clubs or Organizations: Not on file   Attends Archivist Meetings: Not on file   Marital Status: Not on file  Intimate Partner Violence:    Fear of Current or Ex-Partner: Not on file   Emotionally Abused: Not on file   Physically Abused: Not on file   Sexually Abused: Not on file    FAMILY HISTORY: Family History  Problem Relation Age of Onset   Asthma Father    Cancer Father 42       prostate cancer    ALLERGIES:  is allergic to azithromycin,  biaxin [clarithromycin], codeine, remicade [infliximab], and tramadol.  MEDICATIONS:  Current Outpatient Medications  Medication Sig Dispense Refill   albuterol (PROVENTIL HFA;VENTOLIN HFA) 108 (90 Base) MCG/ACT inhaler Inhale 1-2 puffs into the lungs every 6 (six) hours as needed for wheezing or shortness of breath.     beta carotene w/minerals (OCUVITE) tablet Take 1 tablet by mouth daily.     calcium carbonate (OS-CAL) 600 MG TABS tablet Take 600 mg by mouth 2 (two) times daily.      Cholecalciferol (VITAMIN D) 2000 UNITS CAPS Take 2,000 Units by mouth daily.      cycloSPORINE (RESTASIS) 0.05 % ophthalmic emulsion Place 1 drop into both eyes 2 (two) times daily.      famotidine (PEPCID) 20 MG tablet      fluticasone (FLONASE) 50 MCG/ACT nasal spray Place 1 spray into both nostrils daily.     GRAPE SEED CR  PO Take 200 mg by mouth daily.      levothyroxine (SYNTHROID, LEVOTHROID) 50 MCG tablet Take 50 mcg by mouth daily before breakfast.     lithium carbonate 150 MG capsule Take 1 capsule (150 mg total) by mouth at bedtime. 90 capsule 1   LORazepam (ATIVAN) 1 MG tablet Take 1 tablet (1 mg total) by mouth at bedtime as needed. 30 tablet 5   Omega-3 Fatty Acids (FISH OIL) 1000 MG CAPS Take 2,000 mg by mouth 2 (two) times daily.      oxyCODONE (OXY IR/ROXICODONE) 5 MG immediate release tablet Take 0.5-1 tablets (2.5-5 mg total) by mouth every 8 (eight) hours as needed for severe pain. 15 tablet 0   pantoprazole (PROTONIX) 40 MG tablet Take 40 mg by mouth 2 (two) times daily.     prochlorperazine (COMPAZINE) 10 MG tablet Take 1 tablet (10 mg total) by mouth every 6 (six) hours as needed for nausea or vomiting. 30 tablet 0   Pyridoxine HCl (VITAMIN B-6) 500 MG tablet Take 500 mg by mouth daily.     QUEtiapine (SEROQUEL XR) 50 MG TB24 24 hr tablet Take 1 tablet (50 mg total) by mouth every evening. 90 tablet 1   SYMBICORT 80-4.5 MCG/ACT inhaler      vitamin C (ASCORBIC ACID) 500  MG tablet Take 500 mg by mouth daily.     vitamin E 400 UNIT capsule Take 400 Units by mouth daily.     No current facility-administered medications for this visit.    PHYSICAL EXAMINATION: ECOG PERFORMANCE STATUS: 2 - Symptomatic, <50% confined to bed  Vitals:   03/11/20 1521  BP: 135/68  Pulse: (!) 111  Resp: 18  Temp: 99.6 F (37.6 C)  SpO2: 100%   Filed Weights   03/11/20 1521  Weight: 122 lb 12.8 oz (55.7 kg)    GENERAL:alert, no distress and comfortable SKIN: skin color, texture, turgor are normal, no rashes or significant lesions EYES: normal, Conjunctiva are pink and non-injected, sclera clear  NECK: supple, thyroid normal size, non-tender, without nodularity LYMPH:  no palpable lymphadenopathy in the cervical, axillary  LUNGS: clear to auscultation and percussion with normal breathing effort HEART: regular rate & rhythm and no murmurs and no lower extremity edema ABDOMEN:abdomen soft, non-tender and normal bowel sounds (+) Epigastric tenderness  Musculoskeletal:no cyanosis of digits and no clubbing  NEURO: alert & oriented x 3 with fluent speech, no focal motor/sensory deficits  LABORATORY DATA:  I have reviewed the data as listed CBC Latest Ref Rng & Units 12/31/2017 09/24/2014 09/24/2014  WBC 4.0 - 10.5 K/uL 8.3 - 6.2  Hemoglobin 12.0 - 15.0 g/dL 13.3 13.6 12.5  Hematocrit 36 - 46 % 40.9 40.0 39.2  Platelets 150 - 400 K/uL 343 - 236    CMP Latest Ref Rng & Units 12/31/2017 09/24/2014 09/24/2014  Glucose 70 - 99 mg/dL 110(H) 102(H) 101(H)  BUN 8 - 23 mg/dL 17 16 15   Creatinine 0.44 - 1.00 mg/dL 0.79 0.80 0.79  Sodium 135 - 145 mmol/L 142 140 140  Potassium 3.5 - 5.1 mmol/L 3.4(L) 3.5 3.4(L)  Chloride 98 - 111 mmol/L 107 106 108  CO2 22 - 32 mmol/L 26 - 28  Calcium 8.9 - 10.3 mg/dL 9.9 - 9.4  Total Protein 6.5 - 8.1 g/dL 7.9 - 7.6  Total Bilirubin 0.3 - 1.2 mg/dL 0.4 - 0.5  Alkaline Phos 38 - 126 U/L 75 - 94  AST 15 - 41 U/L 33 - 46(H)  ALT  0 - 44 U/L 23 - 53      US Abdomen 03/03/20  IMPRESSION: 1. Multiple hepatic lesions suspicious for hepatic metastatic disease, perhaps from pancreatic primary. CT pancreatic protocol or abdominal MRI is suggested for further evaluation. 2. Spleen at upper limits of normal for size. Would also correlate with any clinical history of or laboratory evidence of liver disease.    CT AP 03/09/20 IMPRESSION: 1. Unfortunately there is widespread bilobar hepatic metastasis. 2. Irregular mass in the mid body the pancreas consistent primary pancreatic adenocarcinoma. 3. Tumor extension into the confluence of the portal vein, splenic vein and SMV. Thrombus within the proximal SMV. Reconstitution of the portal veins. 4. Nodal metastasis to the gastrohepatic ligament. 5. Peritoneal metastasis in the lower ventral abdominal peritoneal space. 6. Small amount free fluid the pelvis.   RADIOGRAPHIC STUDIES: I have personally reviewed the radiological images as listed and agreed with the findings in the report. CT ABDOMEN PELVIS W WO CONTRAST  Result Date: 03/09/2020 CLINICAL DATA:  Multiple hepatic lesion identified on abdominal ultrasound. EXAM: CT ABDOMEN AND PELVIS WITHOUT AND WITH CONTRAST TECHNIQUE: Multidetector CT imaging of the abdomen and pelvis was performed following the standard protocol before and following the bolus administration of intravenous contrast. CONTRAST:  110mL ISOVUE-370 IOPAMIDOL (ISOVUE-370) INJECTION 76% COMPARISON:  Ultrasound 03/03/2020 CT abdomen 12/31/2017 FINDINGS: Lower chest: Lung bases are clear. Hepatobiliary: Multiple round peripherally enhancing hypodense masses within LEFT and and RIGHT hepatic lobe consistent with widespread hepatic metastasis. Example lesion LEFT hepatic lobe measures 2.9 cm image 42/4. Example lesion in the central liver measures 4.3 cm on image 22/4. Example lesion in the subcapsular RIGHT hepatic lobe measures 3.0 cm. Approximately 25 lesions in the liver  Pancreas: Within the mid body of the pancreas is ill-defined hypo enhancing mass measuring 3.8 x 2.9 cm. There is atrophy of the pancreatic tail. This mass abuts the splenic artery in LEFT hepatic artery. RIGHT hepatic artery is replaced extending from the SMA. Spleen: Spleen is normal. The splenic vein appears occluded by the pancreatic mass Adrenals/urinary tract: Adrenal glands and kidneys are normal. The ureters and bladder normal. Stomach/Bowel: Stomach, small bowel, appendix, and cecum are normal. The colon and rectosigmoid colon are normal. Vascular/Lymphatic: Abdominal normal caliber. There is conclusion of the proximal portal vein at the confluence of the SMV and splenic vein (image 40 8/11. There is reconstitution main portal vein and LEFT RIGHT portal vein. Partial thrombosis of the proximal SMV. Enlarged lymph nodes in the gastrohepatic ligament. Reproductive: Uterus and adnexa unremarkable. Other: Small volume free fluid the pelvis. There is nodularity within the ventral peritoneal space of the lower abdomen just above the pubic symphysis. For example 2 adjacent peritoneal nodules measuring 6 mm and 7 mm image 148/11 with peripheral enhancement. Similar adjacent nodules on image 144. Musculoskeletal: None IMPRESSION: 1. Unfortunately there is widespread bilobar hepatic metastasis. 2. Irregular mass in the mid body the pancreas consistent primary pancreatic adenocarcinoma. 3. Tumor extension into the confluence of the portal vein, splenic vein and SMV. Thrombus within the proximal SMV. Reconstitution of the portal veins. 4. Nodal metastasis to the gastrohepatic ligament. 5. Peritoneal metastasis in the lower ventral abdominal peritoneal space. 6. Small amount free fluid the pelvis. These results will be called to the ordering clinician or representative by the Radiologist Assistant, and communication documented in the PACS or [employer]. Electronically Signed   By: Suzy Bouchard M.D.   On:  03/09/2020 16:44   US Abdomen Complete  Result Date:  03/03/2020 CLINICAL DATA:  Abdominal bloating EXAM: ABDOMEN ULTRASOUND COMPLETE COMPARISON:  CT of December 31, 2017 FINDINGS: Gallbladder: No gallbladder wall thickening or cholelithiasis. The small polyp approximately 3.8 mm along the wall of the gallbladder. No reported tenderness over the gallbladder. Common bile duct: Diameter: 3.9 mm Liver: Multiple hepatic lesions and ill-defined hypoechoic areas relative to surrounding hepatic parenchyma. In the anterior RIGHT hemi liver a 4.6 x 2.1 x 3.2 cm masslike area is noted. In the inferior RIGHT hemi liver a 3.7 x 2.6 x 3.7 cm area is demonstrated. Heterogeneity of the LEFT lobe is also noted with suspected mass lesions also in the LEFT hepatic lobe, best seen on sagittal image of the LEFT hemi liver on image 50, measuring 2.7 x 2.4 x 3.4 cm. Hypoechoic pancreas is seen adjacent to this and on some images appears contiguous with or abutting the LEFT lobe lesion. Portal vein is patent on color Doppler imaging with normal direction of blood flow towards the liver. IVC: No abnormality visualized. Pancreas: Tail not well evaluated. Head of the pancreas is diffusely hypoechoic and ill-defined and on some images appears contiguous with LEFT hepatic lobe ir abutting LEFT hepatic lobe lesion in the lateral segment of the LEFT hepatic lobe. Spleen: Spleen at upper limits of normal approximately 11.3 cm craniocaudal extent. Right Kidney: Length: 9.5 cm. Increased cortical echogenicity with parenchymal thinning. No hydronephrosis. Question small extrarenal pelvis or renal sinus cyst. Left Kidney: Length: 9.2 cm. Echogenicity increased on the LEFT as well with cortical thinning. Abdominal aorta: No aneurysm visualized. Other findings: No ascites IMPRESSION: 1. Multiple hepatic lesions suspicious for hepatic metastatic disease, perhaps from pancreatic primary. CT pancreatic protocol or abdominal MRI is suggested for further  evaluation. 2. Spleen at upper limits of normal for size. Would also correlate with any clinical history of or laboratory evidence of liver disease. These results will be called to the ordering clinician or representative by the Radiologist Assistant, and communication documented in the PACS or [employer]. Electronically Signed   By: Zetta Bills M.D.   On: 03/03/2020 09:01    ASSESSMENT & PLAN:  ANAPAOLA KINSEL is a 69 y.o. Caucasian female with a history of Stroke in 09/2014, Bipolar disorder, High cholesterol, HLD, Hypothyroidism, Rheumatoid Arthritis  1. Pancreatic Mass suspicious for pancreatic cancer metastatic to liver, LNs and peritoneum -We discussed her image findings with patient and her husband in great detail. After 1.5 months of bowel habits  changes and significant right abdominal pain she had US Abdomen on 03/03/20 which showed multiple liver lesions. Her CT AP from 03/09/20 indicated widespread bilobar hepatic metastasis and Irregular mass in the mid body the pancreas consistent primary pancreatic adenocarcinoma. Her pancreatic tumor appears to be extending into the portal vein, splenic vein and SMV. Scan also shows nodal and peritoneal metastasis.  -I discussed her imaging is highly suspicious for pancreatic cancer primary metastatic to liver, LNs and Peritoneal. Although Adenocarcinoma is suspected, there is a small possibility of other malignancy such as neuroendocrine tumor. I recommend liver biopsy for definitive diagnosis. She will think about it.  -I also recommend CT  Chest to rule out distant metastasis in chest. She will think about it.  -If biopsy confirms metastatic pancreatic adenocarcinoma, her cancer is stage IV and not eligible for surgery and no longer curable. Systemic treatment such as chemotherapy would be recommended to treat and control her disease and prolong her life. We would adjust her treatment to be as tolerable as possible. I  reviewed common chemo  regimen such as FOLFIRINOX, gemcitabine with or without Abraxane, etc -She notes her apprehension for biopsy and chemotherapy given concern for side effects. I discussed option of Guardian 360 liquid biopsy and tumor marker testing for diagnosis. If she wants more information about her suspected cancer she can read about this on [place]. I also encouraged her to pursue second opinion if that helps her to make decisions.  -Given her suspected pancreatic cancer she is eligible for genetic testing. I also recommend genomic and molecular testing of her biopsy sample to see if she is eligible or target or immunotherapy which is likely more tolerable than chemo.  -If she does not decide to proceed with treatment, her overall prognosis is poor. Without treatment she has the option of Hospice home care to manage her symptoms and quality of life. I would manage her overall care.  -Initial exam showed epigastric tenderness.   -Patient will take time to think about biopsy and treatment, and call me back with her decisions.   2. Constipation/Diarrhea, bloating, Right abdominal pain  -Her baseline is mild constipation controlled with Ducolax. This progressed with bloating since 01/08/20. She recent started having diarrhea in the past 4 days and improved after stopping Doculax.  -She has started imodium and Mylanta now for her loose stool and bloating has improved. I will call in [place] if needed.  -Her right abdominal pain started 2 weeks ago and exacerbated by eating. Her appetite is reduced due to this and her nausea.  -Her pain is moderately controlled on Tylenol 7-8/10. She notes drug sensitivity with Tramadol and Codeine. I will start her on low dose oxycodone 5mg  q8hours starting at half tablet. I reviewed this is narcotics which is a controlled substance and only should be taken as prescribed and not given to anyone else and use will be closely monitored. She voiced good understanding.    -I will call in Compazine and Zofran (03/11/20) to manage her nausea.  -She has lost about 8-109 pounds in the past 2 months. I recommend nutritional supplements 2-3 bottles a day to maintain her weight.  -Will send dietician referral    3. Comorbidities: Stroke in 09/2014, Bipolar disorder, High cholesterol, HLD, Hypothyroidism, Rheumatoid Arthritis, Asthma  -Her bipolar disorder is controlled on medications including Lithium. Continue to F/u with Psychiatrist -Lithium has lead to her hypothyroidism whish is being manage on levothyroxine.  -Dr Buckner Malta is managing her RA. She was previously on Orencia until 01/06/20  -Continue to f/u with PCP as well.    4. Thrombus within the proximal SMV -Her 03/09/20 CT AP showed Thrombus within the proximal SMV.  -She was previously on baby aspirin for her prior stroke. She started Eliquis 03/10/20 for her recent blood clot.     PLAN:  -Send dietician referral  -I prescribed Oxycodone, Compazine today.  -will refill her Eliquis  -F/u open. She will call us back with her decision on biopsy and treatment.   No orders of the defined types were placed in this encounter.   All questions were answered. The patient knows to call the clinic with any problems, questions or concerns. The total time spent in the appointment was 60 minutes.     Truitt Merle, MD 03/11/2020   I, Joslyn Devon, am acting as scribe for Truitt Merle, MD.   I have reviewed the above documentation for accuracy and completeness, and I agree with the above.

## 2020-03-12 ENCOUNTER — Telehealth: Payer: Self-pay | Admitting: General Practice

## 2020-03-12 ENCOUNTER — Telehealth: Payer: Self-pay | Admitting: Hematology

## 2020-03-12 ENCOUNTER — Encounter: Payer: Self-pay | Admitting: Hematology

## 2020-03-12 MED ORDER — APIXABAN 5 MG PO TABS: 5.0000 mg | ORAL_TABLET | Freq: Two times a day (BID) | ORAL | 0 refills | Status: AC

## 2020-03-12 NOTE — Progress Notes (Signed)
Patient's husband Patrick Jupiter calls this morning stating they discussed her diagnosis and she has decided she wants no treatment and to be placed on Hospice.  They are going to reach out to her PCP Dr. Theadore Nan to be their attending for Hospice.  I informed him I would let Dr. Burr Medico know.

## 2020-03-12 NOTE — Telephone Encounter (Signed)
Scheduled appointment per 10/21 sch msg. Spoke to patient who is aware of appointment.

## 2020-03-12 NOTE — Telephone Encounter (Signed)
[place] CSW Progress Notes  Call to patient at request of Prince Rome, RN, nurse navigator to assess for needs/offer support.  Unable to reach patient, left VM w my contact information and encouragement to call back.  Edwyna Shell, LCSW Clinical Social Worker Phone:  [phone]

## 2020-03-30 ENCOUNTER — Telehealth: Payer: Self-pay | Admitting: Psychiatry

## 2020-03-30 ENCOUNTER — Other Ambulatory Visit: Payer: Self-pay | Admitting: Psychiatry

## 2020-03-30 DIAGNOSIS — F3174 Bipolar disorder, in full remission, most recent episode manic: Secondary | ICD-10-CM

## 2020-03-30 DIAGNOSIS — F411 Generalized anxiety disorder: Secondary | ICD-10-CM

## 2020-03-30 MED ORDER — QUETIAPINE FUMARATE 50 MG PO TABS: 100.0000 mg | ORAL_TABLET | Freq: Every day | ORAL | 1 refills | Status: AC

## 2020-03-30 MED ORDER — LORAZEPAM 0.5 MG PO TABS: ORAL_TABLET | ORAL | 1 refills | Status: AC

## 2020-03-30 NOTE — Telephone Encounter (Signed)
Shannon Collins pt's husband called needed advise ASAP. Pt having mania symptoms and is also under Hospice care at this time. [place] @ [phone].

## 2020-03-30 NOTE — Telephone Encounter (Signed)
RTC  [place]  Stage 4 pancreatic CA dx 3 weeks after feeling bad since August. More psychotic. Still taking Seroquel XR 50 mg HS with lithium 150 mg daily. Paranoid about letting anybody helping her.  Will need assistance getting out of chair but won't accept help. Says this is not our house, referred to fan as turtle.  Similar to prior psychosis.   Oxycodone 10 mg  several times daily and trying to find the sweet spot at frequency. Weight down to about 118#.  Hardly eating.  Increase quetiapine to ER 100 mg HS for psychosis and paranoia.    Taking lorazepam 1 mg HS, add 1/2 tablet 3 times daily as needed.    Call prn. Call in a week if not better or sooner if worse.  Lynder Parents, MD, DFAPA

## 2020-03-30 NOTE — Progress Notes (Deleted)
Office Visit Note  Patient: Shannon Collins             Date of Birth: 1950-05-26           MRN: [id number]             PCP: Cari Caraway, MD Referring: Juanita Craver, MD Visit Date: 04/13/2020 Occupation: @GUAROCC @  Subjective:  No chief complaint on file.   History of Present Illness: Shannon Collins is a 68 y.o. female ***   Activities of Daily Living:  Patient reports morning stiffness for *** {minute/hour:19697}.   Patient {ACTIONS;DENIES/REPORTS:21021675::"Denies"} nocturnal pain.  Difficulty dressing/grooming: {ACTIONS;DENIES/REPORTS:21021675::"Denies"} Difficulty climbing stairs: {ACTIONS;DENIES/REPORTS:21021675::"Denies"} Difficulty getting out of chair: {ACTIONS;DENIES/REPORTS:21021675::"Denies"} Difficulty using hands for taps, buttons, cutlery, and/or writing: {ACTIONS;DENIES/REPORTS:21021675::"Denies"}  No Rheumatology ROS completed.   PMFS History:  Patient Active Problem List   Diagnosis Date Noted  . Liver masses 03/11/2020  . GAD (generalized anxiety disorder) 03/14/2018  . TIA (transient ischemic attack) 09/24/2014  . Hypothyroidism 09/24/2014  . Hyperlipidemia 09/24/2014  . Bipolar disorder ([place]) 09/24/2014  . Acute CVA (cerebrovascular accident) ([place]) 09/24/2014  . Right arm weakness   . Stroke with cerebral ischemia ([place])   . Cerebral embolism with cerebral infarction ([place])   . Chronic cough 07/08/2014    Past Medical History:  Diagnosis Date  . Acute CVA (cerebrovascular accident) ([place]) 09/24/2014  . Asthma   . Bipolar disorder ([place]) 09/24/2014  . Cerebral embolism with cerebral infarction ([place])   . Chronic cough 07/08/2014   Spirometry 06/2014: normal CXR 06/2014: clear   . High cholesterol   . Hyperlipidemia 09/24/2014  . Hypothyroidism 09/24/2014  . Rheumatoid arthritis ([place])   . Right arm weakness   . Stroke ([place])   . Stroke with cerebral ischemia ([place])   . Thyroid condition   . TIA (transient ischemic attack) 09/24/2014    Family History   Problem Relation Age of Onset  . Asthma Father   . Cancer Father 60       prostate cancer   Past Surgical History:  Procedure Laterality Date  . CESAREAN SECTION    . EP IMPLANTABLE DEVICE N/A 09/25/2014   Procedure: Loop Recorder Insertion;  Surgeon: Evans Lance, MD;  Location: [facility] INVASIVE CV LAB CUPID;  Service: Cardiovascular;  Laterality: N/A;  . FOOT SURGERY     x2  . TEE WITHOUT CARDIOVERSION N/A 09/25/2014   Procedure: TRANSESOPHAGEAL ECHOCARDIOGRAM (TEE);  Surgeon: Fay Records, MD;  Location[facility] ENDOSCOPY;  Service: Cardiovascular;  Laterality: N/A;   Social History   Social History Narrative  . Not on file   Immunization History  Administered Date(s) Administered  . Influenza-Unspecified 02/20/2014  . PFIZER SARS-COV-2 Vaccination 06/27/2019, 07/22/2019  . Pneumococcal Polysaccharide-23 09/25/2014     Objective: Vital Signs: There were no vitals taken for this visit.   Physical Exam   Musculoskeletal Exam: ***  CDAI Exam: CDAI Score: -- Patient Global: --; Provider Global: -- Swollen: --; Tender: -- Joint Exam 04/13/2020   No joint exam has been documented for this visit   There is currently no information documented on the homunculus. Go to the Rheumatology activity and complete the homunculus joint exam.  Investigation: No additional findings.  Imaging: CT ABDOMEN PELVIS W WO CONTRAST  Result Date: 03/09/2020 CLINICAL DATA:  Multiple hepatic lesion identified on abdominal ultrasound. EXAM: CT ABDOMEN AND PELVIS WITHOUT AND WITH CONTRAST TECHNIQUE: Multidetector CT imaging of the abdomen and pelvis was performed following the standard protocol before and following  the bolus administration of intravenous contrast. CONTRAST:  34mL ISOVUE-370 IOPAMIDOL (ISOVUE-370) INJECTION 76% COMPARISON:  Ultrasound 03/03/2020 CT abdomen 12/31/2017 FINDINGS: Lower chest: Lung bases are clear. Hepatobiliary: Multiple round peripherally enhancing hypodense masses within  LEFT and and RIGHT hepatic lobe consistent with widespread hepatic metastasis. Example lesion LEFT hepatic lobe measures 2.9 cm image 42/4. Example lesion in the central liver measures 4.3 cm on image 22/4. Example lesion in the subcapsular RIGHT hepatic lobe measures 3.0 cm. Approximately 25 lesions in the liver Pancreas: Within the mid body of the pancreas is ill-defined hypo enhancing mass measuring 3.8 x 2.9 cm. There is atrophy of the pancreatic tail. This mass abuts the splenic artery in LEFT hepatic artery. RIGHT hepatic artery is replaced extending from the SMA. Spleen: Spleen is normal. The splenic vein appears occluded by the pancreatic mass Adrenals/urinary tract: Adrenal glands and kidneys are normal. The ureters and bladder normal. Stomach/Bowel: Stomach, small bowel, appendix, and cecum are normal. The colon and rectosigmoid colon are normal. Vascular/Lymphatic: Abdominal normal caliber. There is conclusion of the proximal portal vein at the confluence of the SMV and splenic vein (image 40 8/11. There is reconstitution main portal vein and LEFT RIGHT portal vein. Partial thrombosis of the proximal SMV. Enlarged lymph nodes in the gastrohepatic ligament. Reproductive: Uterus and adnexa unremarkable. Other: Small volume free fluid the pelvis. There is nodularity within the ventral peritoneal space of the lower abdomen just above the pubic symphysis. For example 2 adjacent peritoneal nodules measuring 6 mm and 7 mm image 148/11 with peripheral enhancement. Similar adjacent nodules on image 144. Musculoskeletal: None IMPRESSION: 1. Unfortunately there is widespread bilobar hepatic metastasis. 2. Irregular mass in the mid body the pancreas consistent primary pancreatic adenocarcinoma. 3. Tumor extension into the confluence of the portal vein, splenic vein and SMV. Thrombus within the proximal SMV. Reconstitution of the portal veins. 4. Nodal metastasis to the gastrohepatic ligament. 5. Peritoneal metastasis  in the lower ventral abdominal peritoneal space. 6. Small amount free fluid the pelvis. These results will be called to the ordering clinician or representative by the Radiologist Assistant, and communication documented in the PACS or [employer]. Electronically Signed   By: Suzy Bouchard M.D.   On: 03/09/2020 16:44   US Abdomen Complete  Result Date: 03/03/2020 CLINICAL DATA:  Abdominal bloating EXAM: ABDOMEN ULTRASOUND COMPLETE COMPARISON:  CT of December 31, 2017 FINDINGS: Gallbladder: No gallbladder wall thickening or cholelithiasis. The small polyp approximately 3.8 mm along the wall of the gallbladder. No reported tenderness over the gallbladder. Common bile duct: Diameter: 3.9 mm Liver: Multiple hepatic lesions and ill-defined hypoechoic areas relative to surrounding hepatic parenchyma. In the anterior RIGHT hemi liver a 4.6 x 2.1 x 3.2 cm masslike area is noted. In the inferior RIGHT hemi liver a 3.7 x 2.6 x 3.7 cm area is demonstrated. Heterogeneity of the LEFT lobe is also noted with suspected mass lesions also in the LEFT hepatic lobe, best seen on sagittal image of the LEFT hemi liver on image 50, measuring 2.7 x 2.4 x 3.4 cm. Hypoechoic pancreas is seen adjacent to this and on some images appears contiguous with or abutting the LEFT lobe lesion. Portal vein is patent on color Doppler imaging with normal direction of blood flow towards the liver. IVC: No abnormality visualized. Pancreas: Tail not well evaluated. Head of the pancreas is diffusely hypoechoic and ill-defined and on some images appears contiguous with LEFT hepatic lobe ir abutting LEFT hepatic lobe lesion in the lateral segment  of the LEFT hepatic lobe. Spleen: Spleen at upper limits of normal approximately 11.3 cm craniocaudal extent. Right Kidney: Length: 9.5 cm. Increased cortical echogenicity with parenchymal thinning. No hydronephrosis. Question small extrarenal pelvis or renal sinus cyst. Left Kidney: Length: 9.2 cm.  Echogenicity increased on the LEFT as well with cortical thinning. Abdominal aorta: No aneurysm visualized. Other findings: No ascites IMPRESSION: 1. Multiple hepatic lesions suspicious for hepatic metastatic disease, perhaps from pancreatic primary. CT pancreatic protocol or abdominal MRI is suggested for further evaluation. 2. Spleen at upper limits of normal for size. Would also correlate with any clinical history of or laboratory evidence of liver disease. These results will be called to the ordering clinician or representative by the Radiologist Assistant, and communication documented in the PACS or [employer]. Electronically Signed   By: Zetta Bills M.D.   On: 03/03/2020 09:01    Recent Labs: Lab Results  Component Value Date   WBC 8.3 12/31/2017   HGB 13.3 12/31/2017   PLT 343 12/31/2017   NA 142 12/31/2017   K 3.4 (L) 12/31/2017   CL 107 12/31/2017   CO2 26 12/31/2017   GLUCOSE 110 (H) 12/31/2017   BUN 17 12/31/2017   CREATININE 0.79 12/31/2017   BILITOT 0.4 12/31/2017   ALKPHOS 75 12/31/2017   AST 33 12/31/2017   ALT 23 12/31/2017   PROT 7.9 12/31/2017   ALBUMIN 4.3 12/31/2017   CALCIUM 9.9 12/31/2017   GFRAA >60 12/31/2017    Speciality Comments: No specialty comments available.  Procedures:  No procedures performed Allergies: Azithromycin, Biaxin [clarithromycin], Codeine, Remicade [infliximab], and Tramadol   Assessment / Plan:     Visit Diagnoses: Rheumatoid arthritis with rheumatoid factor of multiple sites without organ or systems involvement ([place]) - RF+, anti-CCP+, non-nodular, erosive, second opinion-Dr. Trudie Reed  High risk medication use - On IV Orencia. D/c MTX-hair loss, enbrel-peripheral neuropathy, PLQ-skin itching, remicade-increased GERD/cough  TIA (transient ischemic attack)  Stroke with cerebral ischemia ([facility])  Acute CVA (cerebrovascular accident) ([place])  History of hypothyroidism  History of hyperlipidemia  GAD (generalized anxiety  disorder)  Liver masses  History of bipolar disorder  Orders: No orders of the defined types were placed in this encounter.  No orders of the defined types were placed in this encounter.   Face-to-face time spent with patient was *** minutes. Greater than 50% of time was spent in counseling and coordination of care.  Follow-Up Instructions: No follow-ups on file.   Ofilia Neas, PA-C  Note - This record has been created using Dragon software.  Chart creation errors have been sought, but may not always  have been located. Such creation errors do not reflect on  the standard of medical care.

## 2020-03-30 NOTE — Telephone Encounter (Signed)
Rtc to [place], patient is exhibiting psychotic symptoms as well as mania. She reports seeing things that aren't there, seeing people that are not there. She doesn't feel they are in "their" home which she is. She does have Pancreatic Stage 4 Cancer. Patrick Jupiter knows she may have missed a few doses her and there, she now does not have access to her medication. Patrick Jupiter is asking for an increase in lorazepam to help her anxiety. I also discussed medications of Seroquel and Lithium, with possibly missing it and it being a low dose anyone could be a factor.  Informed Wayne I would touch base with Dr. Clovis Pu and call him back as soon as I could.

## 2020-04-01 ENCOUNTER — Telehealth: Payer: Self-pay | Admitting: Psychiatry

## 2020-04-01 NOTE — Telephone Encounter (Signed)
Please review

## 2020-04-01 NOTE — Telephone Encounter (Signed)
Pt husband called and wants to know if there are any options or a plan B for him caring for wife with her current mental state.
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
## 2020-04-10 ENCOUNTER — Telehealth: Payer: Self-pay | Admitting: Psychiatry

## 2020-04-10 NOTE — Telephone Encounter (Signed)
Shannon Collins called to say that his wife passed away. He wanted you to know.

## 2020-04-13 ENCOUNTER — Ambulatory Visit: Payer: Medicare Other | Admitting: Rheumatology

## 2020-04-13 DIAGNOSIS — G459 Transient cerebral ischemic attack, unspecified: Secondary | ICD-10-CM

## 2020-04-13 DIAGNOSIS — Z8639 Personal history of other endocrine, nutritional and metabolic disease: Secondary | ICD-10-CM

## 2020-04-13 DIAGNOSIS — Z8659 Personal history of other mental and behavioral disorders: Secondary | ICD-10-CM

## 2020-04-13 DIAGNOSIS — M0579 Rheumatoid arthritis with rheumatoid factor of multiple sites without organ or systems involvement: Secondary | ICD-10-CM

## 2020-04-13 DIAGNOSIS — Z79899 Other long term (current) drug therapy: Secondary | ICD-10-CM

## 2020-04-13 DIAGNOSIS — F411 Generalized anxiety disorder: Secondary | ICD-10-CM

## 2020-04-13 DIAGNOSIS — R160 Hepatomegaly, not elsewhere classified: Secondary | ICD-10-CM

## 2020-04-13 DIAGNOSIS — I639 Cerebral infarction, unspecified: Secondary | ICD-10-CM

## 2020-04-22 DEATH — deceased

## 2020-05-04 ENCOUNTER — Ambulatory Visit: Payer: Medicare Other | Admitting: Rheumatology

## 2020-06-08 ENCOUNTER — Telehealth: Payer: Medicare Other | Admitting: Psychiatry

## 2020-06-09 ENCOUNTER — Ambulatory Visit: Payer: Medicare Other | Admitting: Rheumatology

## 2020-06-30 ENCOUNTER — Ambulatory Visit: Payer: Medicare Other | Admitting: Rheumatology
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
